# Patient Record
Sex: Female | Born: 1965
Health system: Southern US, Community
[De-identification: ages and names within clinical notes are randomized; demographics above are authoritative.]

## PROBLEM LIST (undated history)

## (undated) DIAGNOSIS — F4310 Post-traumatic stress disorder, unspecified: Secondary | ICD-10-CM

## (undated) DIAGNOSIS — D219 Benign neoplasm of connective and other soft tissue, unspecified: Secondary | ICD-10-CM

## (undated) HISTORY — PX: ABDOMINAL HYSTERECTOMY: SHX81

## (undated) HISTORY — DX: Benign neoplasm of connective and other soft tissue, unspecified: D21.9

## (undated) HISTORY — PX: TONSILLECTOMY: SUR1361

## (undated) HISTORY — PX: APPENDECTOMY: SHX54

## (undated) HISTORY — PX: SHOULDER SURGERY: SHX246

## (undated) HISTORY — DX: Post-traumatic stress disorder, unspecified: F43.10

---

## 1998-05-07 ENCOUNTER — Emergency Department (HOSPITAL_COMMUNITY): Admission: EM | Admit: 1998-05-07 | Discharge: 1998-05-07 | Payer: Self-pay | Admitting: Emergency Medicine

## 1998-12-20 ENCOUNTER — Emergency Department (HOSPITAL_COMMUNITY): Admission: EM | Admit: 1998-12-20 | Discharge: 1998-12-20 | Payer: Self-pay | Admitting: Emergency Medicine

## 1999-01-16 ENCOUNTER — Emergency Department (HOSPITAL_COMMUNITY): Admission: EM | Admit: 1999-01-16 | Discharge: 1999-01-16 | Payer: Self-pay | Admitting: Emergency Medicine

## 1999-01-16 ENCOUNTER — Encounter: Payer: Self-pay | Admitting: Emergency Medicine

## 1999-06-03 ENCOUNTER — Other Ambulatory Visit: Admission: RE | Admit: 1999-06-03 | Discharge: 1999-06-03 | Payer: Self-pay | Admitting: Gynecology

## 2001-06-16 ENCOUNTER — Ambulatory Visit (HOSPITAL_COMMUNITY): Admission: RE | Admit: 2001-06-16 | Discharge: 2001-06-16 | Payer: Self-pay | Admitting: Gastroenterology

## 2001-06-16 ENCOUNTER — Encounter: Payer: Self-pay | Admitting: Gastroenterology

## 2005-02-16 ENCOUNTER — Encounter: Admission: RE | Admit: 2005-02-16 | Discharge: 2005-02-16 | Payer: Self-pay | Admitting: Internal Medicine

## 2005-06-30 ENCOUNTER — Observation Stay (HOSPITAL_COMMUNITY): Admission: RE | Admit: 2005-06-30 | Discharge: 2005-07-01 | Payer: Self-pay | Admitting: Obstetrics & Gynecology

## 2011-09-22 ENCOUNTER — Other Ambulatory Visit: Payer: Self-pay | Admitting: Otolaryngology

## 2011-09-22 DIAGNOSIS — R519 Headache, unspecified: Secondary | ICD-10-CM

## 2011-09-24 ENCOUNTER — Ambulatory Visit
Admission: RE | Admit: 2011-09-24 | Discharge: 2011-09-24 | Disposition: A | Discharge status: Home / Self Care | Payer: BC Managed Care – PPO | Source: Ambulatory Visit | Attending: Otolaryngology | Admitting: Otolaryngology

## 2011-09-24 DIAGNOSIS — R519 Headache, unspecified: Secondary | ICD-10-CM

## 2011-09-24 IMAGING — CT CT NECK W/ CM
4 of 5 series · 16 of 33 positions shown, 19 images · IV contrast (75CC OMNI 300)
Comparison: None.

CLINICAL DATA: Left facial pain for 1 month.  Hoarseness for 1
week.

CT NECK WITH CONTRAST
TECHNIQUE: Multidetector CT imaging of the neck was performed with
intravenous contrast.
Contrast: 75mL OMNIPAQUE IOHEXOL 300 MG/ML IV SOLN

[Series 2: axial neck · axial · 0.48mm/px · z∈[-72,+82]mm · 5 of 94 slices shown, 7 images]
[im 16/94  soft-tissue]
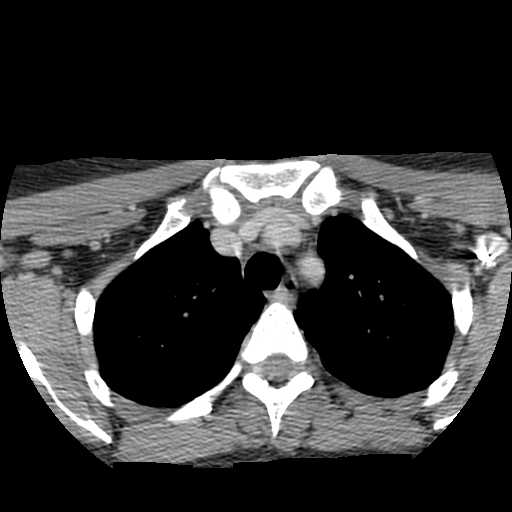
[im 16/94  bone]
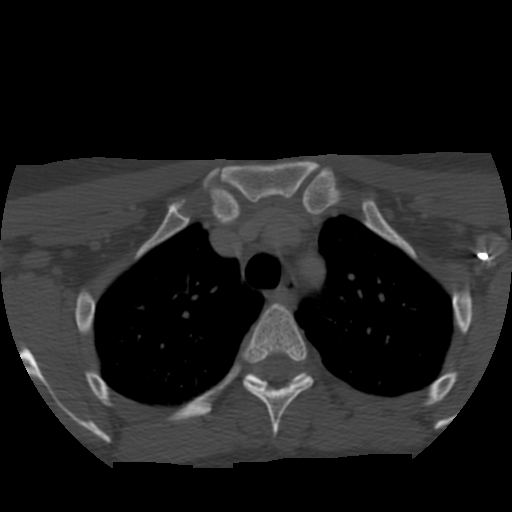
[im 32/94  bone]
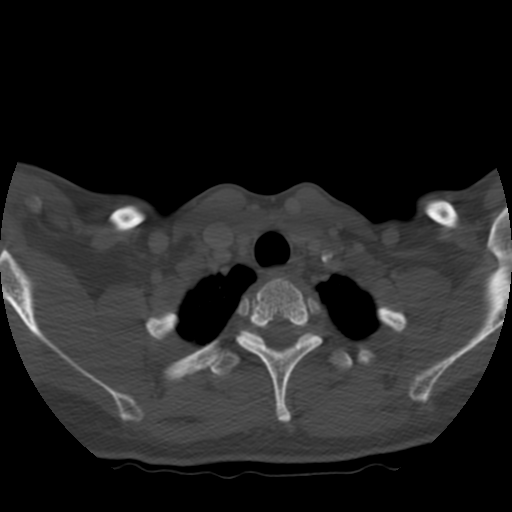
[im 47/94  bone]
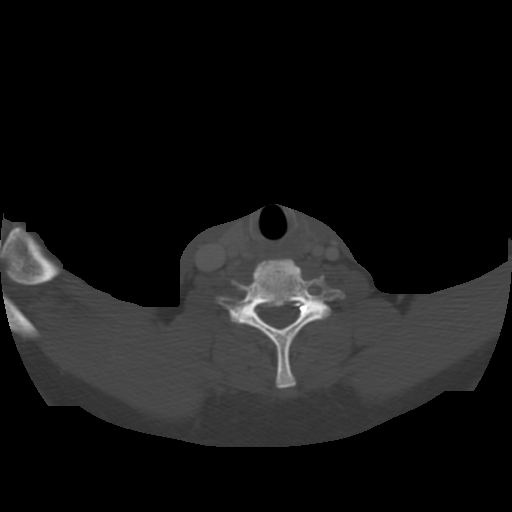
[im 63/94  bone]
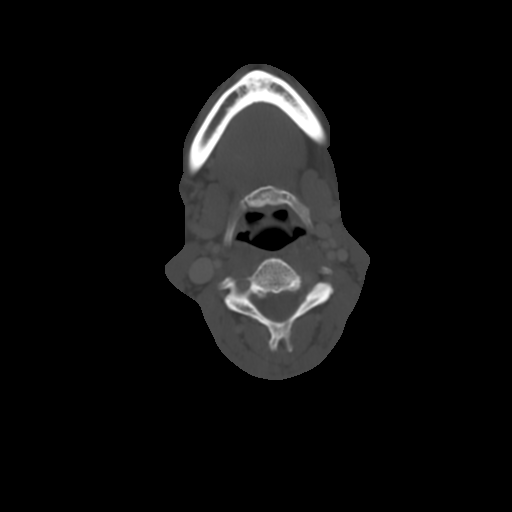
[im 78/94  soft-tissue]
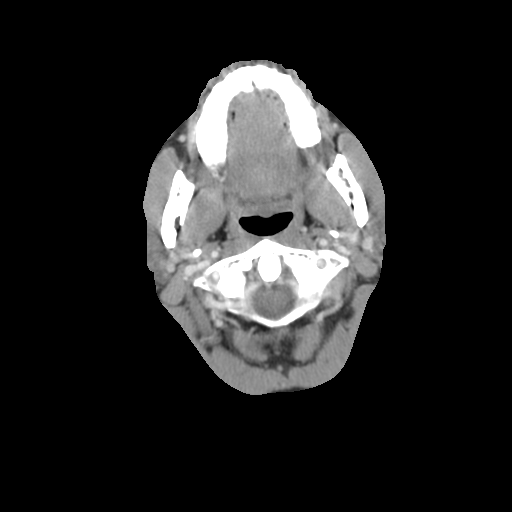
[im 78/94  bone]
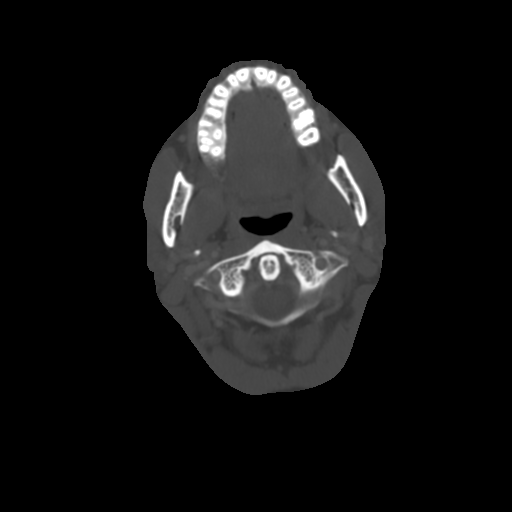

[Series 400: cor · coronal · 0.48mm/px · 3 of 84 slices shown]
[im 17/84  bone]
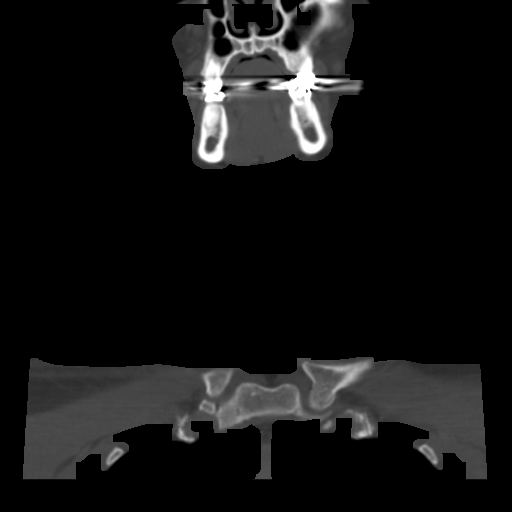
[im 34/84  bone]
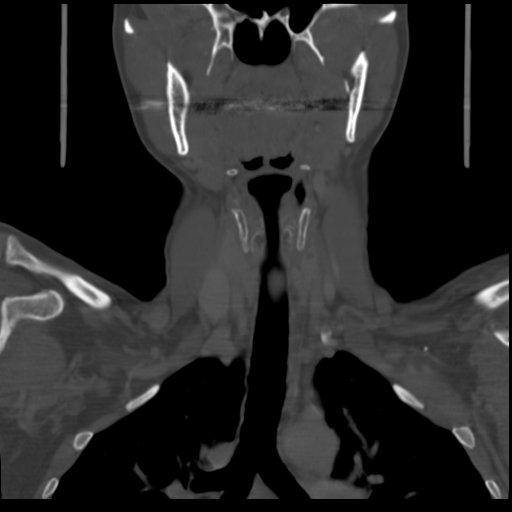
[im 50/84  bone]
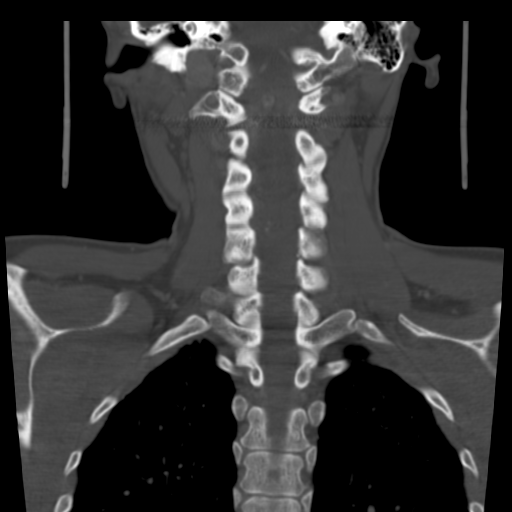

[Series 401: ang axials · axial · 0.48mm/px · z∈[-88,-6]mm · 3 of 87 slices shown]
[im 18/87  bone]
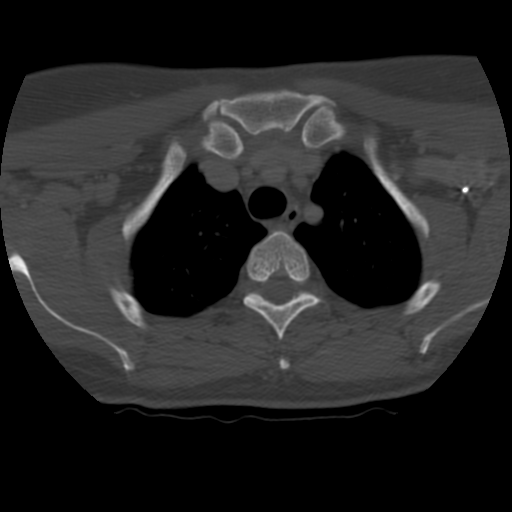
[im 35/87  bone]
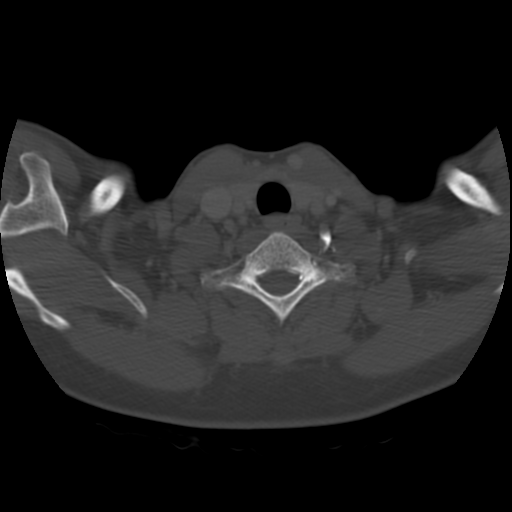
[im 52/87  bone]
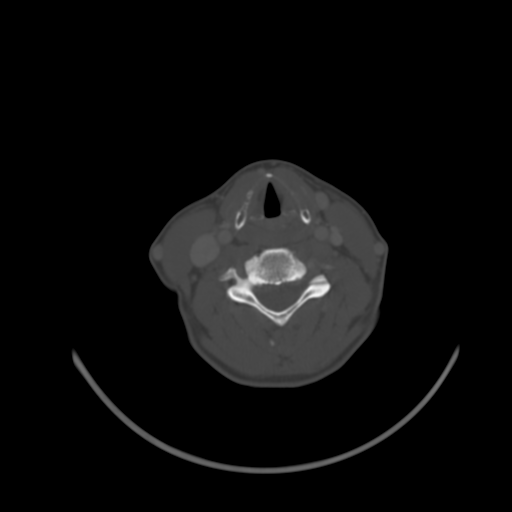

[Series 402: sag · sagittal · 0.48mm/px · 5 of 70 slices shown, 6 images]
[im 24/70  bone]
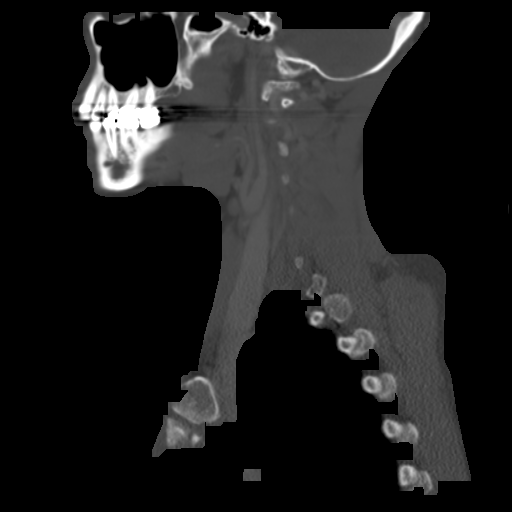
[im 29/70  bone]
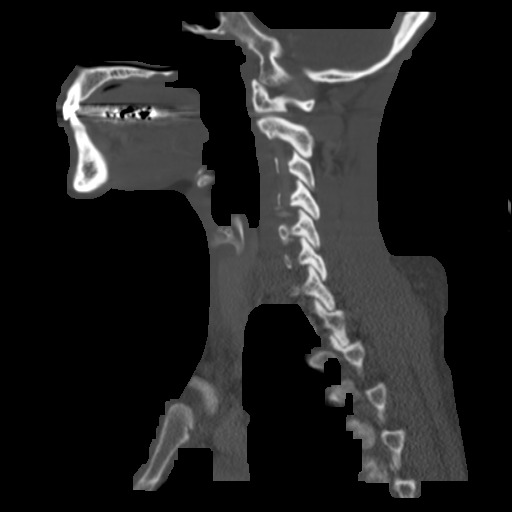
[im 35/70  soft-tissue]
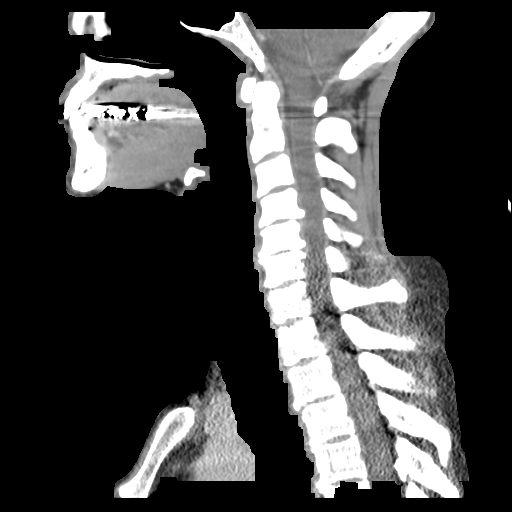
[im 35/70  bone]
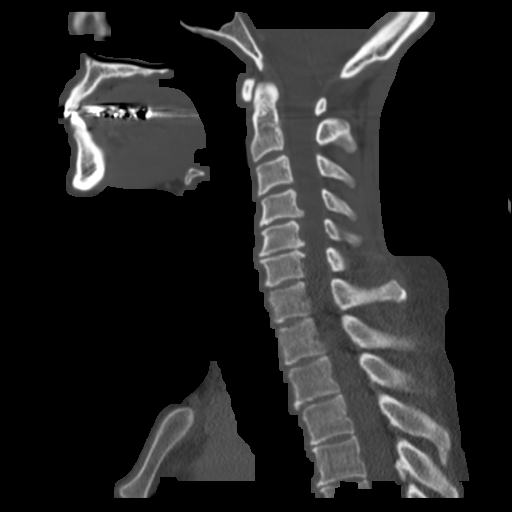
[im 41/70  bone]
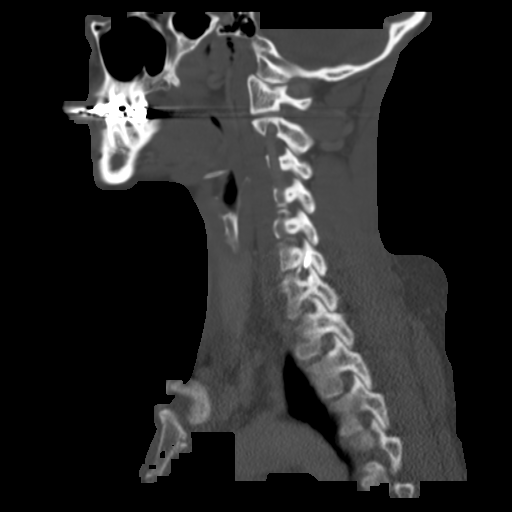
[im 47/70  bone]
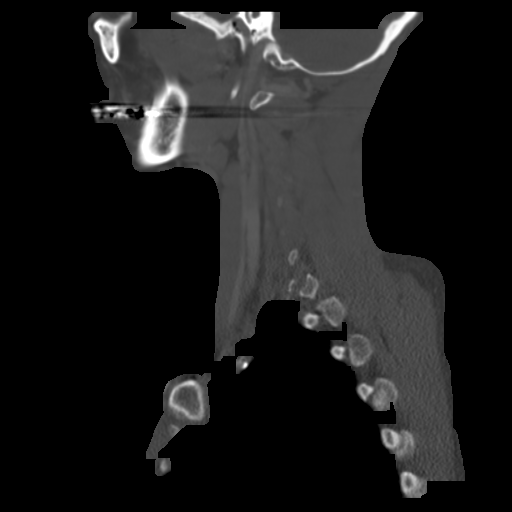

[16 of 33 positions shown; findings below may reference images not displayed]

FINDINGS: No mucosal lesion.  Airway midline.  No vocal cord
abnormality.  Larynx normal.  No tonsillar enlargement.  Normal
major and minor salivary glands.  No skull base or cervical spine
lesion. Moderate spondylosis.  Vascular structures widely patent.
Thyroid gland normal except for a subcentimeter left lobe cystic
lesion.  No mediastinal masses.  Lung apices clear. No visible
sinus or mastoid fluid.
IMPRESSION: Negative CT of the neck.  No cause seen for facial pain or
hoarseness.

## 2011-09-24 MED ORDER — IOHEXOL 300 MG/ML  SOLN
75.0000 mL | Freq: Once | INTRAMUSCULAR | Status: AC | PRN
  Administered 2011-09-24: 75 mL via INTRAVENOUS

## 2012-03-06 ENCOUNTER — Ambulatory Visit (INDEPENDENT_AMBULATORY_CARE_PROVIDER_SITE_OTHER): Payer: BC Managed Care – PPO | Admitting: Family Medicine

## 2012-03-06 ENCOUNTER — Ambulatory Visit: Payer: BC Managed Care – PPO

## 2012-03-06 VITALS — BP 100/60 | HR 58 | Temp 98.3°F | Resp 16 | Ht 64.0 in | Wt 143.0 lb

## 2012-03-06 DIAGNOSIS — R0600 Dyspnea, unspecified: Secondary | ICD-10-CM

## 2012-03-06 DIAGNOSIS — R0609 Other forms of dyspnea: Secondary | ICD-10-CM

## 2012-03-06 DIAGNOSIS — R0989 Other specified symptoms and signs involving the circulatory and respiratory systems: Secondary | ICD-10-CM

## 2012-03-06 DIAGNOSIS — D539 Nutritional anemia, unspecified: Secondary | ICD-10-CM

## 2012-03-06 LAB — POCT CBC
Granulocyte percent: 61.9 %G (ref 37–80)
HCT, POC: 38.5 % (ref 37.7–47.9)
Hemoglobin: 12.5 g/dL (ref 12.2–16.2)
Lymph, poc: 2.3 (ref 0.6–3.4)
MCH, POC: 31.8 pg — AB (ref 27–31.2)
MCHC: 32.5 g/dL (ref 31.8–35.4)
MCV: 97.9 fL — AB (ref 80–97)
MID (cbc): 0.5 (ref 0–0.9)
MPV: 10.7 fL (ref 0–99.8)
POC Granulocyte: 4.5 (ref 2–6.9)
POC LYMPH PERCENT: 31.2 %L (ref 10–50)
POC MID %: 6.9 %M (ref 0–12)
Platelet Count, POC: 225 10*3/uL (ref 142–424)
RBC: 3.93 M/uL — AB (ref 4.04–5.48)
RDW, POC: 13.4 %
WBC: 7.3 10*3/uL (ref 4.6–10.2)

## 2012-03-06 LAB — COMPREHENSIVE METABOLIC PANEL
ALT: 14 U/L (ref 0–35)
BUN: 17 mg/dL (ref 6–23)
CO2: 23 mEq/L (ref 19–32)
Calcium: 9.2 mg/dL (ref 8.4–10.5)
Chloride: 107 mEq/L (ref 96–112)
Creat: 0.69 mg/dL (ref 0.50–1.10)
Glucose, Bld: 80 mg/dL (ref 70–99)

## 2012-03-06 LAB — TSH: TSH: 1.97 u[IU]/mL (ref 0.350–4.500)

## 2012-03-06 IMAGING — CR DG RIBS 2V*L*
3 series · 3 of 3 positions shown · non-contrast
Comparison: None.

CLINICAL DATA: Chest wall pain the

LEFT RIBS - 2 VIEW

[PA]
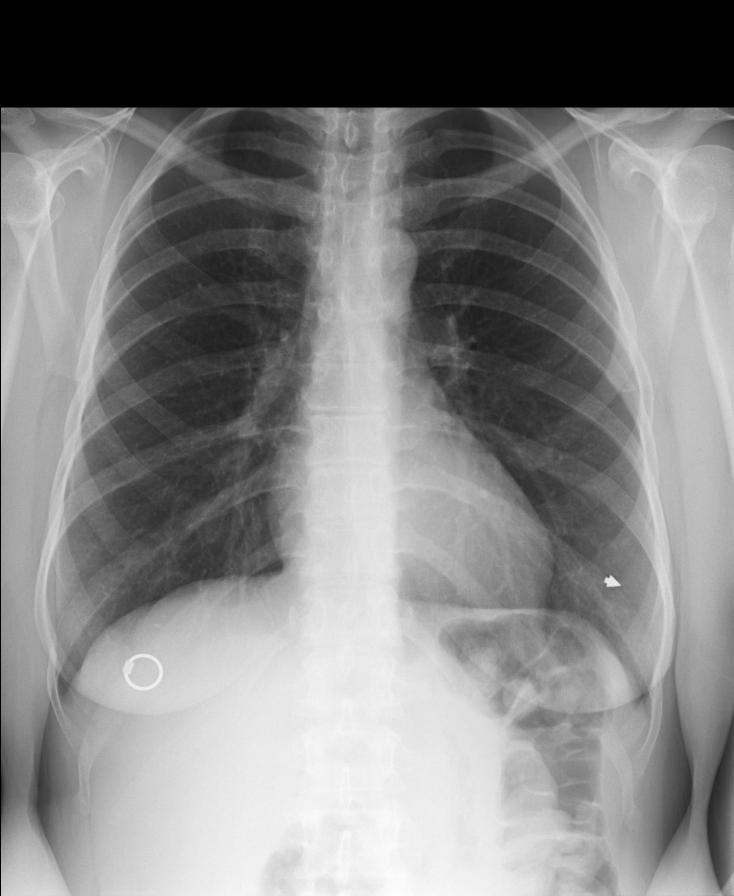

[AP]
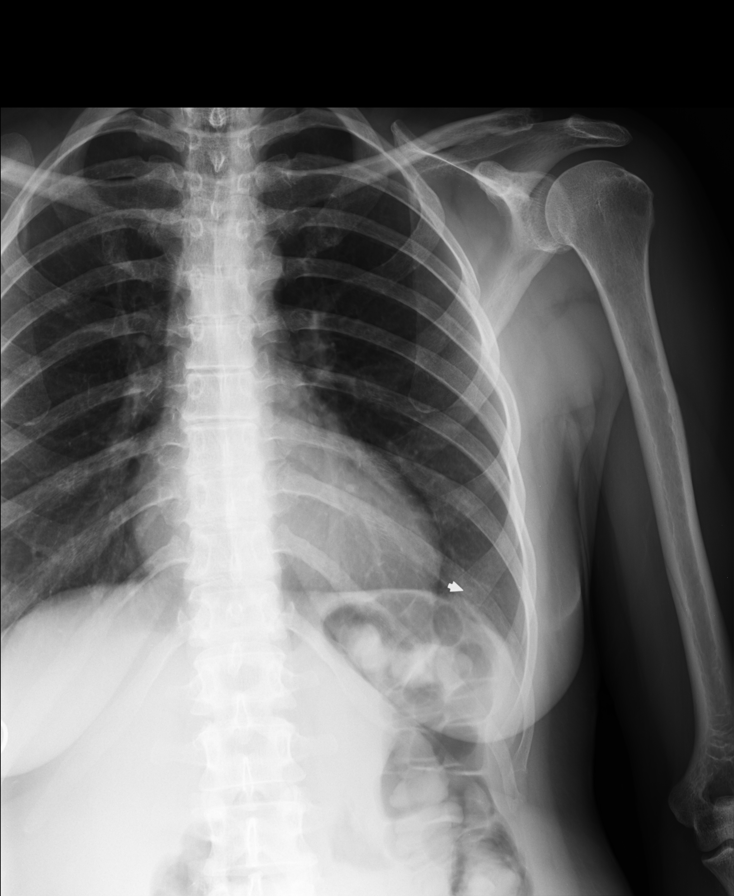

[lpo]
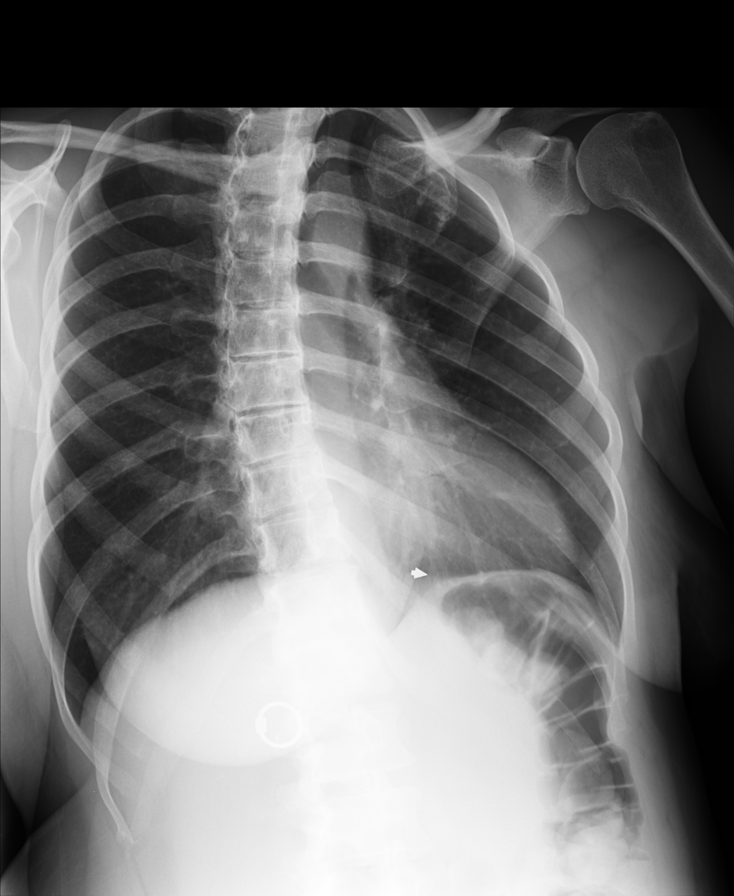

[3 of 3 positions shown; findings below may reference images not displayed]

FINDINGS: Normal mediastinum and cardiac silhouette.  Normal
pulmonary  vasculature.  No evidence of effusion, infiltrate, or
pneumothorax.  No acute bony abnormality.  Dedicated views of the
left ribs demonstrate no displaced fracture.
IMPRESSION: No acute cardiopulmonary process.

Clinically significant discrepancy from primary report, if
provided: None

## 2012-03-06 MED ORDER — BUDESONIDE-FORMOTEROL FUMARATE 160-4.5 MCG/ACT IN AERO: 2.0000 | INHALATION_SPRAY | Freq: Two times a day (BID) | RESPIRATORY_TRACT | Status: DC

## 2012-03-06 MED ORDER — PRENATAL PLUS 27-1 MG PO TABS: 1.0000 | ORAL_TABLET | Freq: Every day | ORAL | Status: DC

## 2012-03-06 NOTE — Progress Notes (Signed)
  Subjective:    Patient ID: Caitlin Pearson, female    DOB: 07/26/1966, 46 y.o.   MRN: [id number]  HPI  Flu like/ bronchitic illness in January with persistent cough for the 6 weeks that follow.  The second week of February developed posterior (L) sided chest wall pain. Since patient developed pain with deep inspiration.  Patient has developed DOE/SOB that is unchanged over the past 2-3 weeks. Does participate in yoga 3 times a week without difficulty however aerobic exercise difficult.  Feels as if breath is restricted.  History of smoking in the past quit 2002 Smoked approximately 17 years 1 ppd   Review of Systems  Constitutional: Positive for fatigue. Negative for fever and chills. Appetite change: appetitie "comes and goes"       Objective:   Physical Exam  Constitutional: She appears well-developed.  HENT:  Head: Normocephalic and atraumatic.  Nose: Mucosal edema: boggy swollen pale nasal mucose.  Mouth/Throat: Oropharyngeal exudate: clear pnd.  Eyes:    Cardiovascular: Normal rate, regular rhythm, S1 normal, S2 normal, normal heart sounds and normal pulses.   Pulmonary/Chest:    Abdominal: Normal appearance.      Results for orders placed in visit on 03/06/12  POCT CBC      Component Value Range   WBC 7.3  4.6 - 10.2 (K/uL)   Lymph, poc 2.3  0.6 - 3.4    POC LYMPH PERCENT 31.2  10 - 50 (%L)   MID (cbc) 0.5  0 - 0.9    POC MID % 6.9  0 - 12 (%M)   POC Granulocyte 4.5  2 - 6.9    Granulocyte percent 61.9  37 - 80 (%G)   RBC 3.93 (*) 4.04 - 5.48 (M/uL)   Hemoglobin 12.5  12.2 - 16.2 (g/dL)   HCT, POC 95.6  21.3 - 47.9 (%)   MCV 97.9 (*) 80 - 97 (fL)   MCH, POC 31.8 (*) 27 - 31.2 (pg)   MCHC 32.5  31.8 - 35.4 (g/dL)   RDW, POC 08.6     Platelet Count, POC 225  142 - 424 (K/uL)   MPV 10.7  0 - 99.8 (fL)    [facility] reading (PRIMARY) by  Dr. Hal Hope Hyperinflation      Assessment & Plan:   1. Dyspnea  POCT CBC, DG Ribs Unilateral Left, Pulmonary function  test, budesonide-formoterol (SYMBICORT) 160-4.5 MCG/ACT inhaler  2. Macrocytic anemia  Comprehensive metabolic panel, TSH, prenatal vitamin w/FE, FA (PRENATAL 1 + 1) 27-1 MG TABS   Anticipatory guidance

## 2012-03-06 NOTE — Patient Instructions (Signed)
(!)   multivitamin daily  Symbicort 2 puffs twice daily; after 10 days reduce to 1 puff twice daily

## 2012-03-20 ENCOUNTER — Telehealth: Payer: Self-pay

## 2012-03-20 NOTE — Telephone Encounter (Signed)
Pt wanted to advise dr Hal Hope that the inhaler rx is not really helping. Please call pt as soon as possible

## 2012-03-20 NOTE — Telephone Encounter (Signed)
LMOM to CB.  Advised patient RTC if symptoms haven't improved since OV on 4/14.

## 2012-03-21 NOTE — Telephone Encounter (Signed)
LM requesting return call.

## 2012-03-21 NOTE — Telephone Encounter (Signed)
Patient returned call.  Continues to have rib pain. CXR with rib detail negative. Patient needs refill of Dulera however has financial limitations. Will look for another coupon for ICS and leave at front desk for patient. Will also prescribe Mobic for pain

## 2012-03-21 NOTE — Telephone Encounter (Signed)
Patient stated that the inhaler is not working and you spoke with her about using another inhaler if she needed it.  She also needs  Another coupon if possible.

## 2012-03-22 ENCOUNTER — Other Ambulatory Visit: Payer: Self-pay | Admitting: Family Medicine

## 2012-03-22 MED ORDER — MOMETASONE FURO-FORMOTEROL FUM 200-5 MCG/ACT IN AERO: 1.0000 | INHALATION_SPRAY | Freq: Two times a day (BID) | RESPIRATORY_TRACT | Status: DC

## 2012-03-23 ENCOUNTER — Telehealth: Payer: Self-pay

## 2012-03-23 NOTE — Telephone Encounter (Signed)
.  [facility] The patient would like to ask Dr. Hal Hope for a Rx for albuterol for day to day use since she does not want to use a medication with steroids.  The patient thanked Dr. Hal Hope for the coupon she left for patient today.  Please call patient regarding Rx [phone].

## 2012-03-25 ENCOUNTER — Other Ambulatory Visit: Payer: Self-pay | Admitting: Family Medicine

## 2012-03-25 MED ORDER — ALBUTEROL SULFATE HFA 108 (90 BASE) MCG/ACT IN AERS: 1.0000 | INHALATION_SPRAY | Freq: Four times a day (QID) | RESPIRATORY_TRACT | Status: DC | PRN

## 2012-03-25 NOTE — Telephone Encounter (Signed)
Please let patient know that albuterol was e prescribed to her pharmacy.

## 2012-06-27 ENCOUNTER — Encounter: Payer: Self-pay | Admitting: Family Medicine

## 2013-01-26 ENCOUNTER — Encounter (HOSPITAL_BASED_OUTPATIENT_CLINIC_OR_DEPARTMENT_OTHER): Payer: Self-pay

## 2013-01-26 ENCOUNTER — Emergency Department (HOSPITAL_BASED_OUTPATIENT_CLINIC_OR_DEPARTMENT_OTHER)
Admission: EM | Admit: 2013-01-26 | Discharge: 2013-01-26 | Disposition: A | Discharge status: Home / Self Care | Payer: Worker's Compensation | Attending: Emergency Medicine | Admitting: Emergency Medicine

## 2013-01-26 DIAGNOSIS — Z79899 Other long term (current) drug therapy: Secondary | ICD-10-CM | POA: Insufficient documentation

## 2013-01-26 DIAGNOSIS — R42 Dizziness and giddiness: Secondary | ICD-10-CM | POA: Insufficient documentation

## 2013-01-26 DIAGNOSIS — M542 Cervicalgia: Secondary | ICD-10-CM | POA: Insufficient documentation

## 2013-01-26 DIAGNOSIS — F0781 Postconcussional syndrome: Secondary | ICD-10-CM | POA: Insufficient documentation

## 2013-01-26 DIAGNOSIS — G8911 Acute pain due to trauma: Secondary | ICD-10-CM | POA: Insufficient documentation

## 2013-01-26 NOTE — ED Notes (Signed)
Reports work related head injury feb 18th-was dx with cervical sprain and concussion-c/o HA, dizziness and numbness to l"left side-pt with steady gait to triage-answering all ?s appropriately

## 2013-01-26 NOTE — ED Notes (Signed)
MD at bedside. 

## 2013-01-26 NOTE — ED Provider Notes (Addendum)
History     CSN: [id number]  Arrival date & time 01/26/13  1516   First MD Initiated Contact with Patient 01/26/13 1543      Chief Complaint  Patient presents with  . Head Injury    (Consider location/radiation/quality/duration/timing/severity/associated sxs/prior treatment) HPI Comments: Patient presents to the ER for evaluation of persistent headache after head injury. Patient reports that she fell at work and injured her head on February 18. She was diagnosed with a cervical sprain and concussion. Patient has had consistent headache and dizziness. She says she is having trouble concentrating and some memory loss. She also has noticed some numbness and tingling on the left side of her face. It hurts when she turns her eyes to the left. She has been undergoing physical therapy. She was seen yesterday and told that she needed to be reevaluated. She tells me that her workers comp case worker told her to come to the emergency department to get a referral to a neurologist.  Patient is a 47 y.o. female presenting with head injury.  Head Injury Associated symptoms: headache and neck pain     History reviewed. No pertinent past medical history.  Past Surgical History  Procedure Laterality Date  . Shoulder surgery    . Abdominal hysterectomy      No family history on file.  History  Substance Use Topics  . Smoking status: Never Smoker   . Smokeless tobacco: Not on file  . Alcohol Use: No    OB History   Grav Para Term Preterm Abortions TAB SAB Ect Mult Living                  Review of Systems  Constitutional: Negative for fever.  HENT: Positive for neck pain.   Neurological: Positive for dizziness and headaches.  Psychiatric/Behavioral: Positive for decreased concentration.  All other systems reviewed and are negative.    Allergies  Penicillins  Home Medications   Current Outpatient Rx  Name  Route  Sig  Dispense  Refill  . albuterol (PROVENTIL HFA;VENTOLIN HFA)  108 (90 BASE) MCG/ACT inhaler   Inhalation   Inhale 1 puff into the lungs every 6 (six) hours as needed for wheezing.   1 Inhaler   3   . budesonide-formoterol (SYMBICORT) 160-4.5 MCG/ACT inhaler   Inhalation   Inhale 2 puffs into the lungs 2 (two) times daily.   1 Inhaler   3   . Mometasone Furo-Formoterol Fum 200-5 MCG/ACT AERO   Inhalation   Inhale 1 puff into the lungs 2 (two) times daily.   1 Inhaler   1   . prenatal vitamin w/FE, FA (PRENATAL 1 + 1) 27-1 MG TABS   Oral   Take 1 tablet by mouth daily.   90 each   3     BP 109/80  Pulse 71  Temp(Src) 98.2 F (36.8 C) (Oral)  Resp 18  Ht 5\' 4"  (1.626 m)  Wt 138 lb (62.596 kg)  BMI 23.68 kg/m2  SpO2 97%  Physical Exam  Constitutional: She is oriented to person, place, and time. She appears well-developed and well-nourished. No distress.  HENT:  Head: Normocephalic and atraumatic.  Right Ear: Hearing normal.  Nose: Nose normal.  Mouth/Throat: Oropharynx is clear and moist and mucous membranes are normal.  Eyes: Conjunctivae and EOM are normal. Pupils are equal, round, and reactive to light.  Neck: Normal range of motion. Neck supple.  Cardiovascular: Normal rate, regular rhythm, S1 normal and S2 normal.  Exam reveals no gallop and no friction rub.   No murmur heard. Pulmonary/Chest: Effort normal and breath sounds normal. No respiratory distress. She exhibits no tenderness.  Abdominal: Soft. Normal appearance and bowel sounds are normal. There is no hepatosplenomegaly. There is no tenderness. There is no rebound, no guarding, no tenderness at McBurney's point and negative Murphy's sign. No hernia.  Musculoskeletal: Normal range of motion.  Neurological: She is alert and oriented to person, place, and time. She has normal strength. No cranial nerve deficit or sensory deficit. Coordination normal. GCS eye subscore is 4. GCS verbal subscore is 5. GCS motor subscore is 6.  Skin: Skin is warm, dry and intact. No rash  noted. No cyanosis.  Psychiatric: She has a normal mood and affect. Her speech is normal and behavior is normal. Thought content normal.    ED Course  Procedures (including critical care time)  Labs Reviewed - No data to display No results found.   Diagnosis: Postconcussive syndrome    MDM  Patient presents to the ER stating that she needs referral to a neurologist. She had a head injury on February 18 and has had persistent symptoms as outlined above. She reports that she is having difficulty looking to the left, but she has conjugate gaze with no cranial nerve deficit on neurologic examination. Neurologic exam is entirely normal.  When I began to explain to the patient that often times workers comp does not honor referrals from the emergency department, patient became very belligerent. She essentially yelled at me that her case worker told her to come here and I needed to refer her to someone.  I once again explained to her that I would gladly refer her to a neurologist, but that the neurologist is not obligated to see her. I tried to prepare her for the possibility that she may need to work with her case worker to make sure the referral goes through. She seems to understand.  I don't see any neurologic deficits to require further workup. She might need an MRI but this is not available at this site. It does not need to be done emergently. I do not feel that CT scan would be of benefit at this time. Patient requires neurologic evaluation and was referred. She can return to the ER if her symptoms worsen.      Gilda Crease, MD 01/26/13 1622  Gilda Crease, MD 01/26/13 [phone]

## 2013-09-28 ENCOUNTER — Other Ambulatory Visit: Payer: Self-pay

## 2013-11-28 DIAGNOSIS — F0781 Postconcussional syndrome: Secondary | ICD-10-CM | POA: Insufficient documentation

## 2013-11-28 DIAGNOSIS — G4700 Insomnia, unspecified: Secondary | ICD-10-CM | POA: Insufficient documentation

## 2014-01-29 DIAGNOSIS — M542 Cervicalgia: Secondary | ICD-10-CM | POA: Insufficient documentation

## 2016-04-08 DIAGNOSIS — S060X0S Concussion without loss of consciousness, sequela: Secondary | ICD-10-CM | POA: Diagnosis not present

## 2016-04-08 DIAGNOSIS — F4321 Adjustment disorder with depressed mood: Secondary | ICD-10-CM | POA: Diagnosis not present

## 2016-04-08 DIAGNOSIS — F068 Other specified mental disorders due to known physiological condition: Secondary | ICD-10-CM | POA: Diagnosis not present

## 2016-04-16 DIAGNOSIS — S060X0S Concussion without loss of consciousness, sequela: Secondary | ICD-10-CM | POA: Diagnosis not present

## 2016-04-16 DIAGNOSIS — F068 Other specified mental disorders due to known physiological condition: Secondary | ICD-10-CM | POA: Diagnosis not present

## 2016-04-16 DIAGNOSIS — F4321 Adjustment disorder with depressed mood: Secondary | ICD-10-CM | POA: Diagnosis not present

## 2016-04-24 DIAGNOSIS — F068 Other specified mental disorders due to known physiological condition: Secondary | ICD-10-CM | POA: Diagnosis not present

## 2016-04-24 DIAGNOSIS — S060X0S Concussion without loss of consciousness, sequela: Secondary | ICD-10-CM | POA: Diagnosis not present

## 2016-04-24 DIAGNOSIS — F4321 Adjustment disorder with depressed mood: Secondary | ICD-10-CM | POA: Diagnosis not present

## 2016-04-30 DIAGNOSIS — F068 Other specified mental disorders due to known physiological condition: Secondary | ICD-10-CM | POA: Diagnosis not present

## 2016-04-30 DIAGNOSIS — S060X0S Concussion without loss of consciousness, sequela: Secondary | ICD-10-CM | POA: Diagnosis not present

## 2016-04-30 DIAGNOSIS — F4321 Adjustment disorder with depressed mood: Secondary | ICD-10-CM | POA: Diagnosis not present

## 2016-05-07 DIAGNOSIS — S060X0S Concussion without loss of consciousness, sequela: Secondary | ICD-10-CM | POA: Diagnosis not present

## 2016-05-07 DIAGNOSIS — F068 Other specified mental disorders due to known physiological condition: Secondary | ICD-10-CM | POA: Diagnosis not present

## 2016-05-07 DIAGNOSIS — F4321 Adjustment disorder with depressed mood: Secondary | ICD-10-CM | POA: Diagnosis not present

## 2016-05-15 DIAGNOSIS — F4321 Adjustment disorder with depressed mood: Secondary | ICD-10-CM | POA: Diagnosis not present

## 2016-05-15 DIAGNOSIS — S060X0S Concussion without loss of consciousness, sequela: Secondary | ICD-10-CM | POA: Diagnosis not present

## 2016-05-15 DIAGNOSIS — F068 Other specified mental disorders due to known physiological condition: Secondary | ICD-10-CM | POA: Diagnosis not present

## 2016-05-21 DIAGNOSIS — S060X0S Concussion without loss of consciousness, sequela: Secondary | ICD-10-CM | POA: Diagnosis not present

## 2016-05-21 DIAGNOSIS — F068 Other specified mental disorders due to known physiological condition: Secondary | ICD-10-CM | POA: Diagnosis not present

## 2016-05-21 DIAGNOSIS — F4321 Adjustment disorder with depressed mood: Secondary | ICD-10-CM | POA: Diagnosis not present

## 2016-05-28 DIAGNOSIS — F4321 Adjustment disorder with depressed mood: Secondary | ICD-10-CM | POA: Diagnosis not present

## 2016-05-28 DIAGNOSIS — F068 Other specified mental disorders due to known physiological condition: Secondary | ICD-10-CM | POA: Diagnosis not present

## 2016-05-28 DIAGNOSIS — S060X0S Concussion without loss of consciousness, sequela: Secondary | ICD-10-CM | POA: Diagnosis not present

## 2016-06-05 DIAGNOSIS — F068 Other specified mental disorders due to known physiological condition: Secondary | ICD-10-CM | POA: Diagnosis not present

## 2016-06-05 DIAGNOSIS — F4321 Adjustment disorder with depressed mood: Secondary | ICD-10-CM | POA: Diagnosis not present

## 2016-06-05 DIAGNOSIS — S060X0S Concussion without loss of consciousness, sequela: Secondary | ICD-10-CM | POA: Diagnosis not present

## 2016-06-16 DIAGNOSIS — F4321 Adjustment disorder with depressed mood: Secondary | ICD-10-CM | POA: Diagnosis not present

## 2016-06-16 DIAGNOSIS — F068 Other specified mental disorders due to known physiological condition: Secondary | ICD-10-CM | POA: Diagnosis not present

## 2016-06-16 DIAGNOSIS — S060X0S Concussion without loss of consciousness, sequela: Secondary | ICD-10-CM | POA: Diagnosis not present

## 2016-06-26 DIAGNOSIS — F4321 Adjustment disorder with depressed mood: Secondary | ICD-10-CM | POA: Diagnosis not present

## 2016-07-03 DIAGNOSIS — F4321 Adjustment disorder with depressed mood: Secondary | ICD-10-CM | POA: Diagnosis not present

## 2016-07-09 DIAGNOSIS — F4321 Adjustment disorder with depressed mood: Secondary | ICD-10-CM | POA: Diagnosis not present

## 2016-07-24 DIAGNOSIS — F4321 Adjustment disorder with depressed mood: Secondary | ICD-10-CM | POA: Diagnosis not present

## 2016-08-03 DIAGNOSIS — F4321 Adjustment disorder with depressed mood: Secondary | ICD-10-CM | POA: Diagnosis not present

## 2016-08-12 DIAGNOSIS — F4321 Adjustment disorder with depressed mood: Secondary | ICD-10-CM | POA: Diagnosis not present

## 2016-08-17 DIAGNOSIS — F4321 Adjustment disorder with depressed mood: Secondary | ICD-10-CM | POA: Diagnosis not present

## 2016-08-26 DIAGNOSIS — F4321 Adjustment disorder with depressed mood: Secondary | ICD-10-CM | POA: Diagnosis not present

## 2016-09-04 DIAGNOSIS — F4321 Adjustment disorder with depressed mood: Secondary | ICD-10-CM | POA: Diagnosis not present

## 2016-09-11 DIAGNOSIS — F4321 Adjustment disorder with depressed mood: Secondary | ICD-10-CM | POA: Diagnosis not present

## 2016-09-16 DIAGNOSIS — F4321 Adjustment disorder with depressed mood: Secondary | ICD-10-CM | POA: Diagnosis not present

## 2016-09-28 DIAGNOSIS — F4321 Adjustment disorder with depressed mood: Secondary | ICD-10-CM | POA: Diagnosis not present

## 2016-10-05 DIAGNOSIS — F4321 Adjustment disorder with depressed mood: Secondary | ICD-10-CM | POA: Diagnosis not present

## 2016-10-13 DIAGNOSIS — Z13228 Encounter for screening for other metabolic disorders: Secondary | ICD-10-CM | POA: Diagnosis not present

## 2016-10-13 DIAGNOSIS — B377 Candidal sepsis: Secondary | ICD-10-CM | POA: Diagnosis not present

## 2016-10-13 DIAGNOSIS — R799 Abnormal finding of blood chemistry, unspecified: Secondary | ICD-10-CM | POA: Diagnosis not present

## 2016-10-13 DIAGNOSIS — R5383 Other fatigue: Secondary | ICD-10-CM | POA: Diagnosis not present

## 2016-10-14 DIAGNOSIS — F4321 Adjustment disorder with depressed mood: Secondary | ICD-10-CM | POA: Diagnosis not present

## 2016-10-19 DIAGNOSIS — F4321 Adjustment disorder with depressed mood: Secondary | ICD-10-CM | POA: Diagnosis not present

## 2016-10-27 DIAGNOSIS — F4321 Adjustment disorder with depressed mood: Secondary | ICD-10-CM | POA: Diagnosis not present

## 2016-11-05 DIAGNOSIS — F4321 Adjustment disorder with depressed mood: Secondary | ICD-10-CM | POA: Diagnosis not present

## 2016-11-12 DIAGNOSIS — F4321 Adjustment disorder with depressed mood: Secondary | ICD-10-CM | POA: Diagnosis not present

## 2016-11-26 DIAGNOSIS — F4321 Adjustment disorder with depressed mood: Secondary | ICD-10-CM | POA: Diagnosis not present

## 2016-12-03 DIAGNOSIS — F4321 Adjustment disorder with depressed mood: Secondary | ICD-10-CM | POA: Diagnosis not present

## 2016-12-10 DIAGNOSIS — F4321 Adjustment disorder with depressed mood: Secondary | ICD-10-CM | POA: Diagnosis not present

## 2016-12-18 DIAGNOSIS — F4321 Adjustment disorder with depressed mood: Secondary | ICD-10-CM | POA: Diagnosis not present

## 2016-12-24 DIAGNOSIS — F4321 Adjustment disorder with depressed mood: Secondary | ICD-10-CM | POA: Diagnosis not present

## 2018-02-18 DIAGNOSIS — M25551 Pain in right hip: Secondary | ICD-10-CM | POA: Diagnosis not present

## 2018-02-18 DIAGNOSIS — M5136 Other intervertebral disc degeneration, lumbar region: Secondary | ICD-10-CM | POA: Diagnosis not present

## 2018-02-21 ENCOUNTER — Ambulatory Visit: Payer: BLUE CROSS/BLUE SHIELD | Admitting: Sports Medicine

## 2018-02-21 ENCOUNTER — Encounter: Payer: Self-pay | Admitting: Sports Medicine

## 2018-02-21 VITALS — BP 104/72 | Ht 64.0 in | Wt 134.0 lb

## 2018-02-21 DIAGNOSIS — S76311A Strain of muscle, fascia and tendon of the posterior muscle group at thigh level, right thigh, initial encounter: Secondary | ICD-10-CM

## 2018-02-21 MED ORDER — DICLOFENAC SODIUM 75 MG PO TBEC: 75.0000 mg | DELAYED_RELEASE_TABLET | Freq: Two times a day (BID) | ORAL | 0 refills | Status: DC

## 2018-02-21 NOTE — Progress Notes (Signed)
   Subjective:    Patient ID: Caitlin Pearson, female    DOB: 01-27-1966, 52 y.o.   MRN: [id number]   CC: hamstring tear  HPI: Patient is a 52 year old female who presents today for hamstring pain in in the setting of a recent diagnosis for hamstring tear.Patient reports that two weeks ago she was running to catch an uber at the [place] airport when she felt a "pop". Patient states that she was unable to run immediately after and had some swelling and bruising for the next few days. Patient continue to have pain and was seen by orthopedic who diagnosed her with a hamstring tear. Patient has been using heat which seem to provide some relief with her pain. She has not filled her flexeril prescription yet but reports a spasms that prevent her from sleeping at night. 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  Smoking status reviewed   ROS: all other systems were reviewed and are negative other than in the HPI   No past medical history on file.  Past Surgical History:  Procedure Laterality Date  . ABDOMINAL HYSTERECTOMY    . SHOULDER SURGERY      Past medical history, surgical, family, and social history reviewed and updated in the EMR as appropriate.  Objective:  BP 104/72   Ht 5\' 4"  (1.626 m)   Wt 134 lb (60.8 kg)   BMI 23.00 kg/m   Vitals and nursing note reviewed  General: NAD, pleasant, able to participate in exam Cardiac: RRR, normal heart sounds, no murmurs. 2+ radial and PT pulses bilaterally Respiratory: CTAB, normal effort, No wheezes, rales or rhonchi Abdomen: soft, nontender, nondistended, no hepatic or splenomegaly, +BS Right leg  Exam: 4/5 strength,sensation intact, tender to palpation in the proximal hamstring muscle with palpation, minimal bruising and no swelling noted. Flexion, extension, internal and external rotation intact. Extremities: no edema or cyanosis.  WWP. Skin: warm and dry, no rashes noted Neuro: alert and oriented x4, no focal deficits Psych: Normal affect and mood   Assessment & Plan:    Strain of right hamstring Patient presents after recent diagnosis of right  hamstring pain by ortho. Patient continue to experience significant pain in her right upper thigh on palpation . Swelling has resolved but mild bruising is present on exam. Heat has been helping with symptoms. U/S did not show any significant tear or inflammation of right hamstring. However given initial pain, swelling and bruising patient likely has a partial tear. She also endorses spasms. Patient will benefit from NSAIDs and PT at this point of her treatment. We also discussed compression either with thigh sleeve or bicycle short for additional support. No indication for surgical intervention. --Prescribe Diclofenac 75 mg bid --Compression for additional support --Physical therapy [facility]  --Flexeril 10 mg qhs     Marjie Skiff, MD [place] PGY-2  Patient seen and evaluated with the resident. I agree with the above plan of care. MSK ultrasound shows the proximal hamstring tendon to be intact. Treatment as above. X-rays of her lumbar spine and pelvis are reviewed. These are from an outside source. Pelvis x-rays are unremarkable. X-rays of the lumbar spine shows advanced degenerative disc disease at L5-S1 with a degenerative spondylolisthesis at L4-L5. However, her symptoms are not related to her lumbar spine pathology. Follow-up as needed.

## 2018-02-21 NOTE — Patient Instructions (Signed)
It was great seeing you today! We have addressed the following issues today  1. Base on our exam and Ultrasound finding you do not have a significant tear of your hamstring muscle. You do however most likely have a partial tear. 2. We will recommend compression to help pain either bicycle shorts or thigh sleeve.  3. Diclofenac twice a day to help with inflammation 4. Flexeril to help with muscle spasm and sleep. Only take in the evening. 5. Will also set you up with Physical therapy for the next few weeks.  If we did any lab work today, and the results require attention, either me or my nurse will get in touch with you. If everything is normal, you will get a letter in mail and a message via . If you don't hear from [place] in two weeks, please give [place] a call. Otherwise, we look forward to seeing you again at your next visit. If you have any questions or concerns before then, please call the clinic at [phone].  Please bring all your medications to every doctors visit  Sign up for My Chart to have easy access to your labs results, and communication with your Primary care physician. Please ask Front Desk for some assistance.   Please check-out at the front desk before leaving the clinic.    Take Care,   Dr. Andy Gauss

## 2018-02-21 NOTE — Assessment & Plan Note (Addendum)
Patient presents after recent diagnosis of right  hamstring pain by ortho. Patient continue to experience significant pain in her right upper thigh on palpation . Swelling has resolved but mild bruising is present on exam. Heat has been helping with symptoms. U/S did not show any significant tear or inflammation of right hamstring. However given initial pain, swelling and bruising patient likely has a partial tear. She also endorses spasms. Patient will benefit from NSAIDs and PT at this point of her treatment. We also discussed compression either with thigh sleeve or bicycle short for additional support. No indication for surgical intervention. --Prescribe Diclofenac 75 mg bid --Compression for additional support --Physical therapy [facility]  --Flexeril 10 mg qhs --Follow up in clinic as needed if symptoms do not improve or worsen.

## 2018-03-04 DIAGNOSIS — S76311D Strain of muscle, fascia and tendon of the posterior muscle group at thigh level, right thigh, subsequent encounter: Secondary | ICD-10-CM | POA: Diagnosis not present

## 2018-03-10 DIAGNOSIS — S76311D Strain of muscle, fascia and tendon of the posterior muscle group at thigh level, right thigh, subsequent encounter: Secondary | ICD-10-CM | POA: Diagnosis not present

## 2018-03-16 DIAGNOSIS — S76311D Strain of muscle, fascia and tendon of the posterior muscle group at thigh level, right thigh, subsequent encounter: Secondary | ICD-10-CM | POA: Diagnosis not present

## 2018-03-21 DIAGNOSIS — S76311D Strain of muscle, fascia and tendon of the posterior muscle group at thigh level, right thigh, subsequent encounter: Secondary | ICD-10-CM | POA: Diagnosis not present

## 2018-03-24 DIAGNOSIS — S76311D Strain of muscle, fascia and tendon of the posterior muscle group at thigh level, right thigh, subsequent encounter: Secondary | ICD-10-CM | POA: Diagnosis not present

## 2018-04-07 DIAGNOSIS — S76311D Strain of muscle, fascia and tendon of the posterior muscle group at thigh level, right thigh, subsequent encounter: Secondary | ICD-10-CM | POA: Diagnosis not present

## 2018-04-14 DIAGNOSIS — S76311D Strain of muscle, fascia and tendon of the posterior muscle group at thigh level, right thigh, subsequent encounter: Secondary | ICD-10-CM | POA: Diagnosis not present

## 2018-04-25 DIAGNOSIS — S76311D Strain of muscle, fascia and tendon of the posterior muscle group at thigh level, right thigh, subsequent encounter: Secondary | ICD-10-CM | POA: Diagnosis not present

## 2018-05-02 DIAGNOSIS — S76311D Strain of muscle, fascia and tendon of the posterior muscle group at thigh level, right thigh, subsequent encounter: Secondary | ICD-10-CM | POA: Diagnosis not present

## 2018-05-09 DIAGNOSIS — S76311D Strain of muscle, fascia and tendon of the posterior muscle group at thigh level, right thigh, subsequent encounter: Secondary | ICD-10-CM | POA: Diagnosis not present

## 2018-05-24 DIAGNOSIS — S76311D Strain of muscle, fascia and tendon of the posterior muscle group at thigh level, right thigh, subsequent encounter: Secondary | ICD-10-CM | POA: Diagnosis not present

## 2018-05-31 DIAGNOSIS — S76311D Strain of muscle, fascia and tendon of the posterior muscle group at thigh level, right thigh, subsequent encounter: Secondary | ICD-10-CM | POA: Diagnosis not present

## 2018-06-14 DIAGNOSIS — S76311D Strain of muscle, fascia and tendon of the posterior muscle group at thigh level, right thigh, subsequent encounter: Secondary | ICD-10-CM | POA: Diagnosis not present

## 2018-06-23 DIAGNOSIS — S76311D Strain of muscle, fascia and tendon of the posterior muscle group at thigh level, right thigh, subsequent encounter: Secondary | ICD-10-CM | POA: Diagnosis not present

## 2018-06-30 DIAGNOSIS — S76311D Strain of muscle, fascia and tendon of the posterior muscle group at thigh level, right thigh, subsequent encounter: Secondary | ICD-10-CM | POA: Diagnosis not present

## 2018-07-14 DIAGNOSIS — S76311D Strain of muscle, fascia and tendon of the posterior muscle group at thigh level, right thigh, subsequent encounter: Secondary | ICD-10-CM | POA: Diagnosis not present

## 2018-07-28 DIAGNOSIS — S76311D Strain of muscle, fascia and tendon of the posterior muscle group at thigh level, right thigh, subsequent encounter: Secondary | ICD-10-CM | POA: Diagnosis not present

## 2018-08-17 DIAGNOSIS — S76311D Strain of muscle, fascia and tendon of the posterior muscle group at thigh level, right thigh, subsequent encounter: Secondary | ICD-10-CM | POA: Diagnosis not present

## 2018-08-25 DIAGNOSIS — S76311D Strain of muscle, fascia and tendon of the posterior muscle group at thigh level, right thigh, subsequent encounter: Secondary | ICD-10-CM | POA: Diagnosis not present

## 2018-09-22 DIAGNOSIS — S76311D Strain of muscle, fascia and tendon of the posterior muscle group at thigh level, right thigh, subsequent encounter: Secondary | ICD-10-CM | POA: Diagnosis not present

## 2018-10-13 DIAGNOSIS — S76311D Strain of muscle, fascia and tendon of the posterior muscle group at thigh level, right thigh, subsequent encounter: Secondary | ICD-10-CM | POA: Diagnosis not present

## 2018-11-04 DIAGNOSIS — J019 Acute sinusitis, unspecified: Secondary | ICD-10-CM | POA: Diagnosis not present

## 2018-11-04 DIAGNOSIS — Z87828 Personal history of other (healed) physical injury and trauma: Secondary | ICD-10-CM | POA: Diagnosis not present

## 2018-11-04 DIAGNOSIS — L710 Perioral dermatitis: Secondary | ICD-10-CM | POA: Diagnosis not present

## 2018-11-04 DIAGNOSIS — F4310 Post-traumatic stress disorder, unspecified: Secondary | ICD-10-CM | POA: Diagnosis not present

## 2018-11-04 DIAGNOSIS — Z0001 Encounter for general adult medical examination with abnormal findings: Secondary | ICD-10-CM | POA: Diagnosis not present

## 2018-11-17 ENCOUNTER — Other Ambulatory Visit: Payer: Self-pay | Admitting: Family Medicine

## 2018-11-17 DIAGNOSIS — Z0001 Encounter for general adult medical examination with abnormal findings: Secondary | ICD-10-CM | POA: Diagnosis not present

## 2018-11-17 DIAGNOSIS — M542 Cervicalgia: Secondary | ICD-10-CM

## 2018-11-17 DIAGNOSIS — IMO0002 Reserved for concepts with insufficient information to code with codable children: Secondary | ICD-10-CM

## 2018-11-17 DIAGNOSIS — Z1231 Encounter for screening mammogram for malignant neoplasm of breast: Secondary | ICD-10-CM

## 2018-11-17 DIAGNOSIS — R229 Localized swelling, mass and lump, unspecified: Principal | ICD-10-CM

## 2018-11-17 DIAGNOSIS — K089 Disorder of teeth and supporting structures, unspecified: Secondary | ICD-10-CM | POA: Diagnosis not present

## 2018-11-28 ENCOUNTER — Other Ambulatory Visit: Payer: Self-pay | Admitting: Family Medicine

## 2018-11-30 ENCOUNTER — Other Ambulatory Visit: Payer: Self-pay

## 2018-12-09 ENCOUNTER — Other Ambulatory Visit: Payer: Self-pay

## 2018-12-09 DIAGNOSIS — K089 Disorder of teeth and supporting structures, unspecified: Secondary | ICD-10-CM | POA: Diagnosis not present

## 2018-12-13 ENCOUNTER — Other Ambulatory Visit: Payer: Self-pay | Admitting: Family Medicine

## 2018-12-13 DIAGNOSIS — R220 Localized swelling, mass and lump, head: Principal | ICD-10-CM

## 2018-12-13 DIAGNOSIS — J3489 Other specified disorders of nose and nasal sinuses: Secondary | ICD-10-CM

## 2018-12-16 ENCOUNTER — Other Ambulatory Visit: Payer: Self-pay

## 2018-12-22 ENCOUNTER — Other Ambulatory Visit: Payer: Self-pay

## 2018-12-29 ENCOUNTER — Ambulatory Visit
Admission: RE | Admit: 2018-12-29 | Discharge: 2018-12-29 | Disposition: A | Discharge status: Home / Self Care | Payer: BLUE CROSS/BLUE SHIELD | Source: Ambulatory Visit | Attending: Family Medicine | Admitting: Family Medicine

## 2018-12-29 DIAGNOSIS — R220 Localized swelling, mass and lump, head: Principal | ICD-10-CM

## 2018-12-29 DIAGNOSIS — J0100 Acute maxillary sinusitis, unspecified: Secondary | ICD-10-CM | POA: Diagnosis not present

## 2018-12-29 DIAGNOSIS — J3489 Other specified disorders of nose and nasal sinuses: Secondary | ICD-10-CM

## 2018-12-29 IMAGING — CT CT MAXILLOFACIAL W/ CM
1 series · 16 of 30 positions shown, 20 images · IV contrast (APPLIED)
Comparison: None.

CLINICAL DATA: Facial numbness and headache. Diagnosis of RIGHT
facial mass 2 months ago, 2 teeth extraction 2 weeks ago.

EXAM:
CT MAXILLOFACIAL WITH CONTRAST
TECHNIQUE: Multidetector CT imaging of the maxillofacial structures was
performed with intravenous contrast. Multiplanar CT image
reconstructions were also generated.
CONTRAST:  75mL [2D] IOPAMIDOL ([2D]) INJECTION 61%

[Series 4: maxofacial -soft · axial · 0.39mm/px · z∈[-190,-22]mm · 16 of 92 slices shown, 20 images]
[im 4/92  brain]
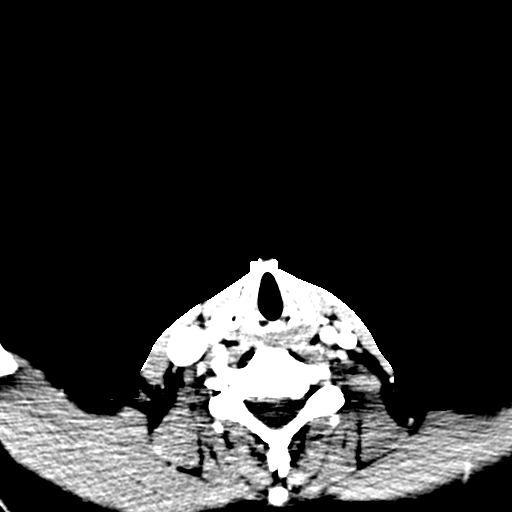
[im 4/92  bone]
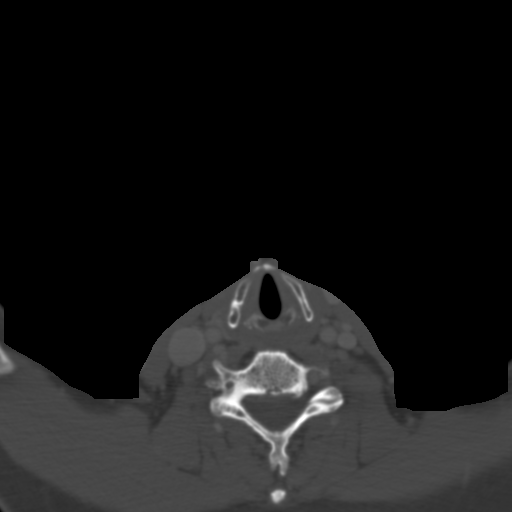
[im 10/92  bone]
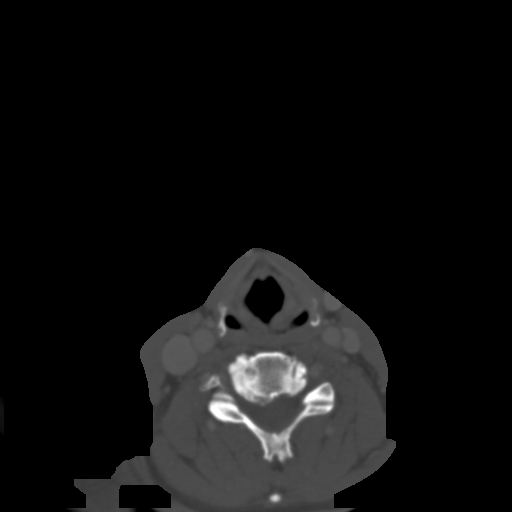
[im 16/92  bone]
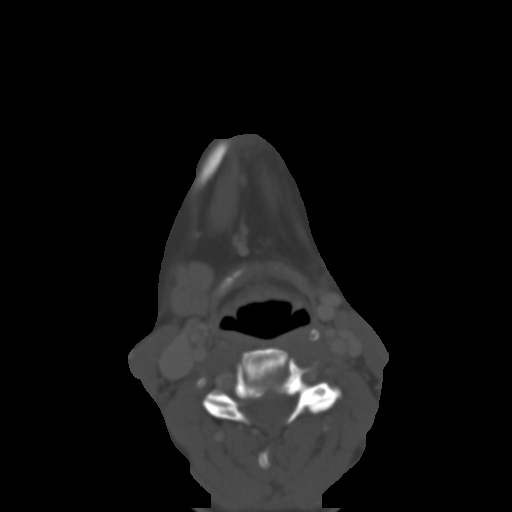
[im 22/92  bone]
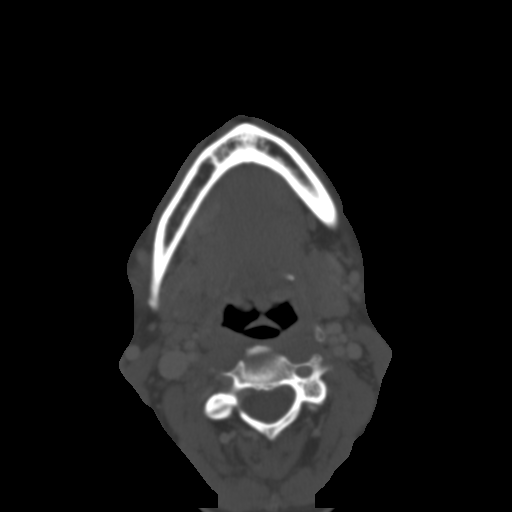
[im 26/92  brain]
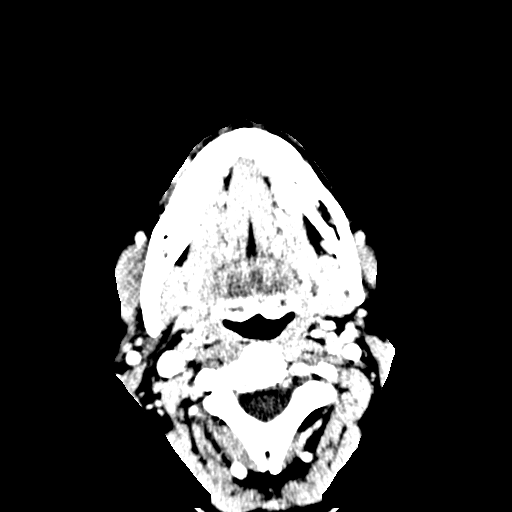
[im 26/92  bone]
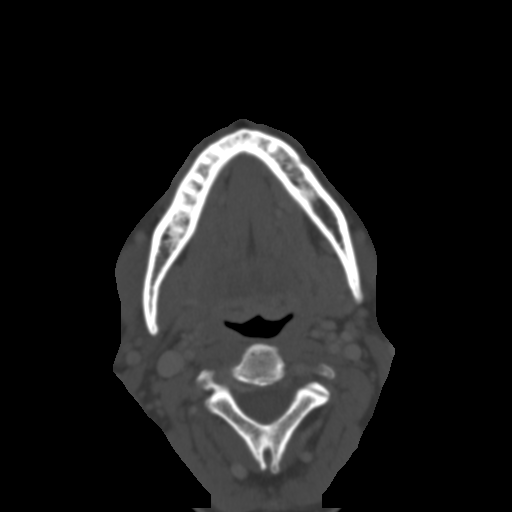
[im 32/92  bone]
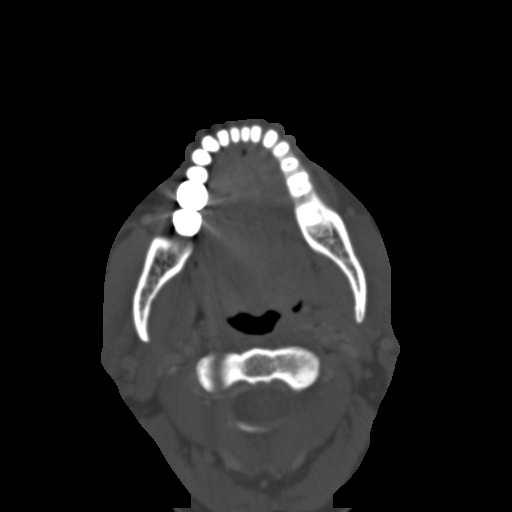
[im 38/92  bone]
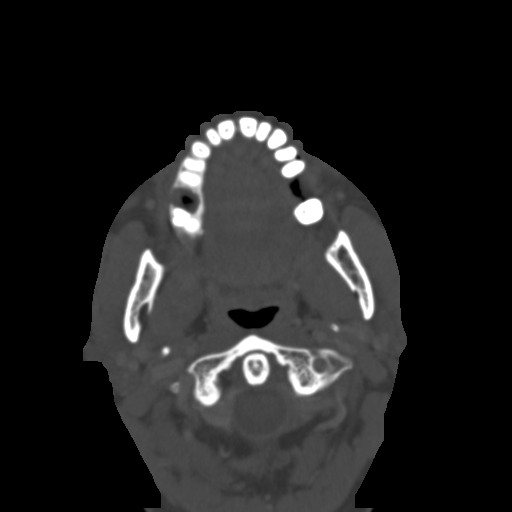
[im 44/92  bone]
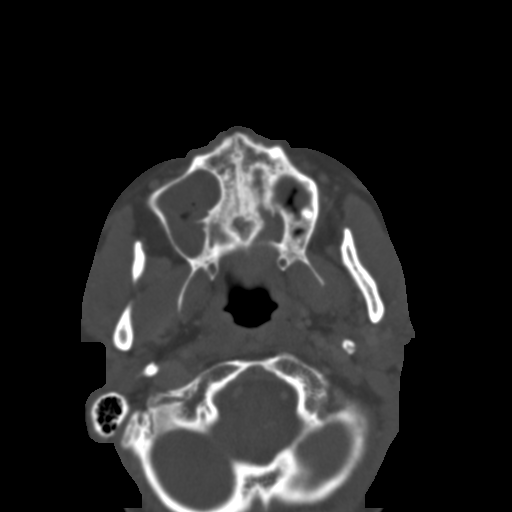
[im 48/92  brain]
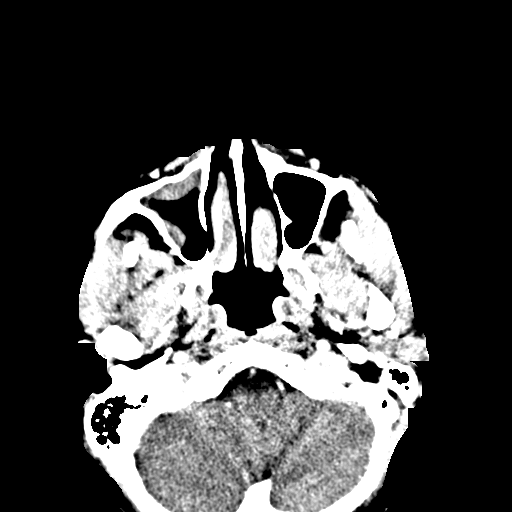
[im 48/92  bone]
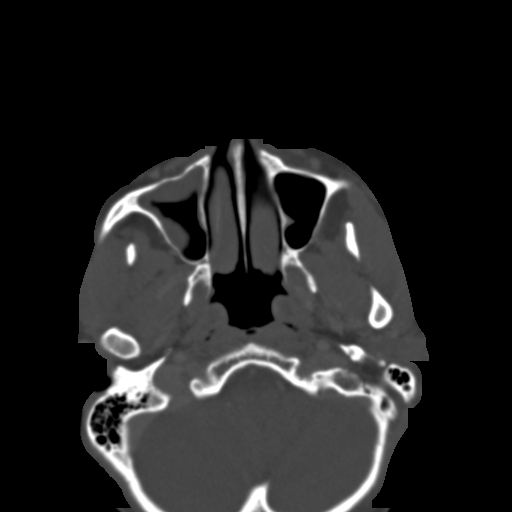
[im 54/92  bone]
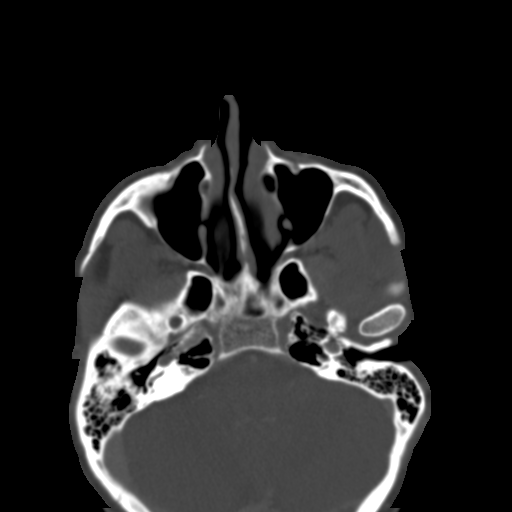
[im 60/92  bone]
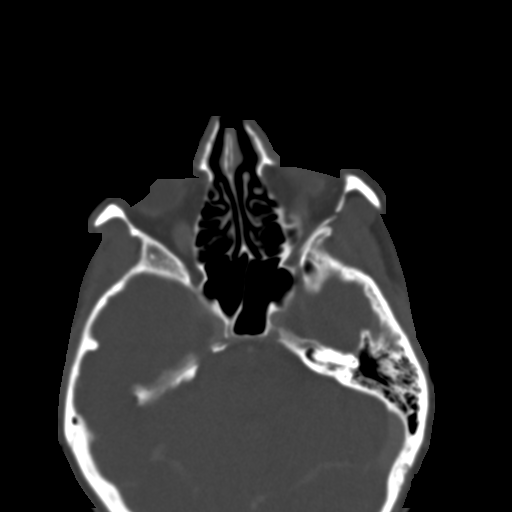
[im 66/92  bone]
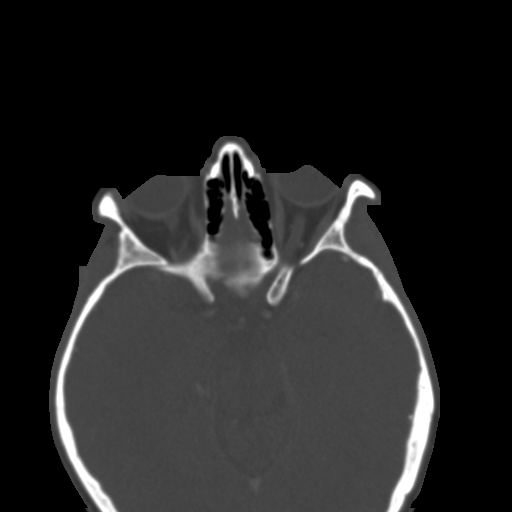
[im 70/92  brain]
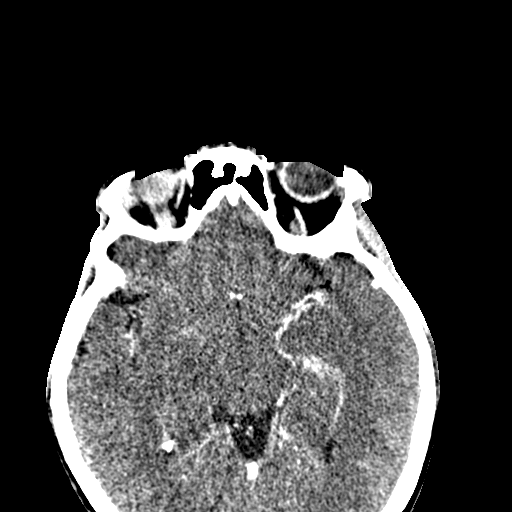
[im 70/92  bone]
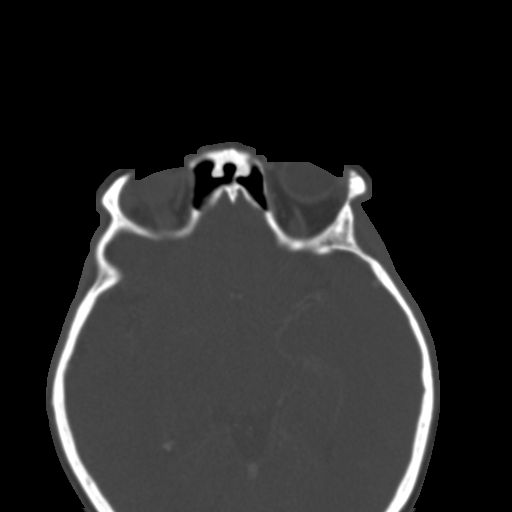
[im 76/92  bone]
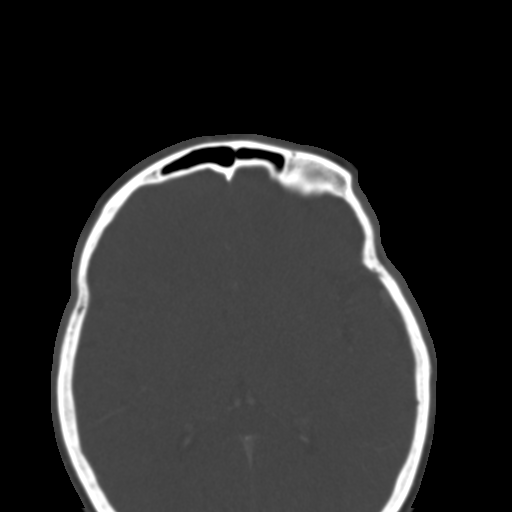
[im 82/92  bone]
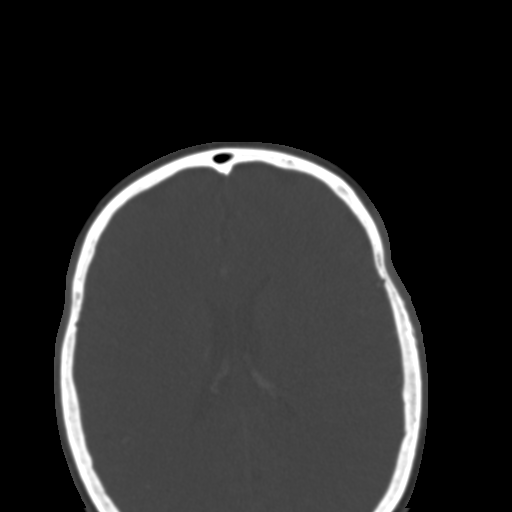
[im 88/92  bone]
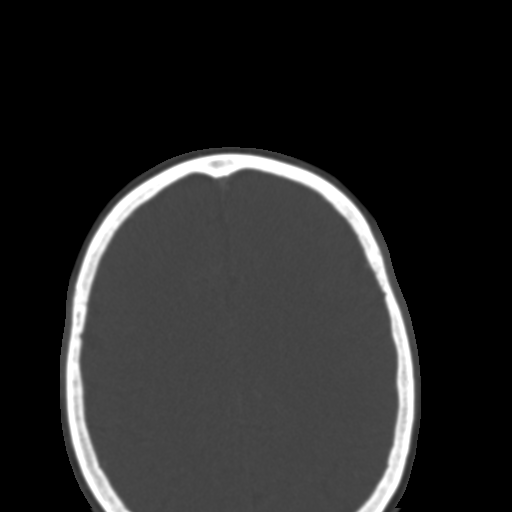

[16 of 30 positions shown; findings below may reference images not displayed]

FINDINGS: OSSEOUS: No acute facial fracture. The mandible is intact, the
condyles are located. No destructive bony lesions. Recent bilateral
mandible molar extraction, RIGHT oral antral fistula.

ORBITS: Ocular globes and orbital contents are normal.

SINUSES: Mild lobulated maxillary sinus mucosal thickening without
paranasal sinus air-fluid levels. Mastoid air cells are well
aerated.

SOFT TISSUES: No significant soft tissue swelling. No subcutaneous
gas or radiopaque foreign bodies.

LIMITED INTRACRANIAL: Normal.
IMPRESSION: 1. Mild maxillary sinusitis with RIGHT oral antral fistula; recent
bilateral mandible molar dental extractions.

## 2018-12-29 MED ORDER — IOPAMIDOL (ISOVUE-300) INJECTION 61%
75.0000 mL | Freq: Once | INTRAVENOUS | Status: AC | PRN
  Administered 2018-12-29: 75 mL via INTRAVENOUS

## 2019-01-05 DIAGNOSIS — R202 Paresthesia of skin: Secondary | ICD-10-CM | POA: Diagnosis not present

## 2019-01-09 ENCOUNTER — Encounter: Payer: Self-pay | Admitting: Neurology

## 2019-01-18 DIAGNOSIS — X398XXD Other exposure to forces of nature, subsequent encounter: Secondary | ICD-10-CM | POA: Diagnosis not present

## 2019-01-18 DIAGNOSIS — R202 Paresthesia of skin: Secondary | ICD-10-CM | POA: Diagnosis not present

## 2019-01-27 NOTE — Progress Notes (Signed)
[facility] Clinic Note - Initial Visit   Date: 01/30/19  Caitlin Pearson [facility] MRN: [id number] DOB: 1966-02-16   Dear Dr. Darron Doom:  Thank you for your kind referral of Caitlin Pearson for consultation of facial paresthesias. Although her history is well known to you, please allow [place] to reiterate it for the purpose of our medical record. The patient was accompanied to the clinic by self.    History of Present Illness: Caitlin Pearson is a 53 y.o. right-handed Caucasian female with PTSD, vitamin D deficiency, and anxiety  presenting for evaluation of facial paresthesia.   In October 2019, she began having change in taste followed by numbness of the tongue, entire face, and scalp.  She also complains of neck pain and difficulty swallowing.  She feels numbness and tingling over both arms.  Symptoms are constant, no specific triggers.  She was worried that her filling were leaking mercury so had them all removed, she was told by her dentist her filling looked like a "toxic waste dump".  She is very anxious and was given given a prescription by her PCP for anxiety, but is not taking it.  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  She was on a lot of antidepressant and anxiety medication in the 1990s, but due to cognitive side effects, has not been on anything since this time.  She denies double vision, limb weakness, or falls.    Out-side paper records, electronic medical record, and images have been reviewed where available and summarized as:  Labs 11/05/2018:  HbA1c 4.7, TSH 1.52, vitamin D 15.9*  Past Medical History:  Diagnosis Date  . PTSD (post-traumatic stress disorder)     Past Surgical History:  Procedure Laterality Date  . ABDOMINAL HYSTERECTOMY    . SHOULDER SURGERY       Medications:  Outpatient Encounter Medications as of 01/30/2019  Medication Sig  . [DISCONTINUED] cyclobenzaprine (FLEXERIL) 10 MG tablet   .  [DISCONTINUED] diclofenac (VOLTAREN) 75 MG EC tablet Take 1 tablet (75 mg total) by mouth 2 (two) times daily.   No facility-administered encounter medications on file as of 01/30/2019.     Allergies:  Allergies  Allergen Reactions  . Penicillins Anaphylaxis and Hives    Family History: Family History  Problem Relation Age of Onset  . Cerebral aneurysm Mother   . COPD Father   . Obesity Brother     Social History: Social History   Tobacco Use  . Smoking status: Never Smoker  . Smokeless tobacco: Never Used  Substance Use Topics  . Alcohol use: No    Comment: 2 times weekly  . Drug use: No   Social History   Social History Narrative  . Not on file    Review of Systems:  CONSTITUTIONAL: No fevers, chills, night sweats, or weight loss.   EYES: No visual changes or eye pain ENT: No hearing changes.  No history of nose bleeds.   RESPIRATORY: No cough, wheezing and shortness of breath.   CARDIOVASCULAR: Negative for chest pain, and palpitations.   GI: Negative for abdominal discomfort, blood in stools or black stools.  No recent change in bowel habits.   GU:  No history of incontinence.   MUSCLOSKELETAL: No history of joint pain or swelling.  No myalgias.   SKIN: Negative for lesions, rash, and itching.   HEMATOLOGY/ONCOLOGY: Negative for prolonged bleeding, bruising easily, and swollen nodes.  No history of cancer.  ENDOCRINE: Negative for cold or heat intolerance, polydipsia or goiter.   PSYCH:  No depression +anxiety symptoms.   NEURO: As Above.   Vital Signs:  BP 100/68   Pulse 94   Ht '5\' 4"'  (1.626 m)   Wt 138 lb (62.6 kg)   SpO2 99%   BMI 23.69 kg/m    General Medical Exam: Gen:  Highly anxious, psychomotor agitation.   Eyes/ENT: see cranial nerve examination.   Neck:   No carotid bruits. Respiratory:  Clear to auscultation, good air entry bilaterally.   Cardiac:  Regular rate and rhythm, no murmur.   Extremities:  No deformities, edema, or skin  discoloration.  Skin:  No rashes or lesions.  Neurological Exam: MENTAL STATUS including orientation to time, place, person, recent and remote memory, attention span and concentration, language, and fund of knowledge is fairly intact.  Speech is not dysarthric.  CRANIAL NERVES: II:  No visual field defects.  Unremarkable fundi.   III-IV-VI: Pupils equal round and reactive to light.  Normal conjugate, extra-ocular eye movements in all directions of gaze.  No nystagmus.  No ptosis.   V:  Normal facial sensation.   Jaw jerk is absent.   VII:  Normal facial symmetry and movements.  Myeron's sign is absent.  VIII:  Normal hearing and vestibular function.   IX-X:  Normal palatal movement.   XI:  Normal shoulder shrug and head rotation.   XII:  Normal tongue strength and range of motion, no deviation or fasciculation.  MOTOR:  Motor strength is 5/5 throughout. No atrophy, fasciculations or abnormal movements.  No pronator drift.   MSRs:  Right                                                                 Left brachioradialis 2+  brachioradialis 2+  biceps 2+  biceps 2+  triceps 2+  triceps 2+  patellar 2+  patellar 2+  ankle jerk 2+  ankle jerk 2+  Hoffman no  Hoffman no  plantar response down  plantar response down   SENSORY:  Normal and symmetric perception of light touch, pinprick, vibration, and proprioception.  Romberg's sign absent.   COORDINATION/GAIT: Normal finger-to- nose-finger.  Intact rapid alternating movements bilaterally. Gait narrow based and stable. Tandem and stressed gait intact.    IMPRESSION: Caitlin Pearson is a 53 year-old female presenting with myriad of symptoms including facial paresthesias involving the entire face, scalp, and distal tongue and bilateral arm paresthesias.  Her exam is non-focal, which is reassuring.  I will check MRI brain wwo contrast to be sure there is no demyelinating disease, however, my overall suspicion is low given the widespread  distribution of paresthesias and lack of supporting exam findings.  Additionally, laboratory testing for vitamin B12, vitamin B1, and folate deficiency will be ordered.  Further recommendations pending results.  Thank you for allowing me to participate in patient's care.  If I can answer any additional questions, I would be pleased to do so.    Sincerely,    Jlynn Ly K. Posey Pronto, DO

## 2019-01-30 ENCOUNTER — Encounter: Payer: Self-pay | Admitting: Neurology

## 2019-01-30 ENCOUNTER — Other Ambulatory Visit (INDEPENDENT_AMBULATORY_CARE_PROVIDER_SITE_OTHER): Payer: BLUE CROSS/BLUE SHIELD

## 2019-01-30 ENCOUNTER — Ambulatory Visit: Payer: BLUE CROSS/BLUE SHIELD | Admitting: Neurology

## 2019-01-30 VITALS — BP 100/68 | HR 94 | Ht 64.0 in | Wt 138.0 lb

## 2019-01-30 DIAGNOSIS — R202 Paresthesia of skin: Secondary | ICD-10-CM

## 2019-01-30 DIAGNOSIS — R209 Unspecified disturbances of skin sensation: Secondary | ICD-10-CM

## 2019-01-30 LAB — VITAMIN B12: Vitamin B-12: >1525 pg/mL — ABNORMAL HIGH (ref 211–911)

## 2019-01-30 LAB — FOLATE: Folate: 7.4 ng/mL (ref >5.9)

## 2019-01-30 NOTE — Patient Instructions (Addendum)
MRI brain wwo contrast  Check labs  We will call you with the results

## 2019-02-04 LAB — VITAMIN B1: Vitamin B1 (Thiamine): 120 nmol/L — ABNORMAL HIGH (ref 8–30)

## 2019-02-05 ENCOUNTER — Ambulatory Visit
Admission: RE | Admit: 2019-02-05 | Discharge: 2019-02-05 | Disposition: A | Discharge status: Home / Self Care | Payer: BLUE CROSS/BLUE SHIELD | Source: Ambulatory Visit | Attending: Neurology | Admitting: Neurology

## 2019-02-05 ENCOUNTER — Other Ambulatory Visit: Payer: Self-pay

## 2019-02-05 DIAGNOSIS — R202 Paresthesia of skin: Secondary | ICD-10-CM

## 2019-02-05 DIAGNOSIS — R200 Anesthesia of skin: Secondary | ICD-10-CM | POA: Diagnosis not present

## 2019-02-05 DIAGNOSIS — R51 Headache: Secondary | ICD-10-CM | POA: Diagnosis not present

## 2019-02-05 DIAGNOSIS — R209 Unspecified disturbances of skin sensation: Principal | ICD-10-CM

## 2019-02-05 IMAGING — MR MRI HEAD WITHOUT AND WITH CONTRAST
12 series · 48 of 48 positions shown · IV contrast (multihance)
Comparison: None.

CLINICAL DATA: Headaches facial numbness. History of traumatic
brain injury in [9Y].

EXAM:
MRI HEAD WITHOUT AND WITH CONTRAST
TECHNIQUE: Multiplanar, multiecho pulse sequences of the brain and surrounding
structures were obtained without and with intravenous contrast.
CONTRAST:  10mL MULTIHANCE GADOBENATE DIMEGLUMINE 529 MG/ML IV SOLN

[Series 2: T1 · sagittal · 5.0mm · 0.45mm/px · 1 of 21 slices shown]
[im 1/21]
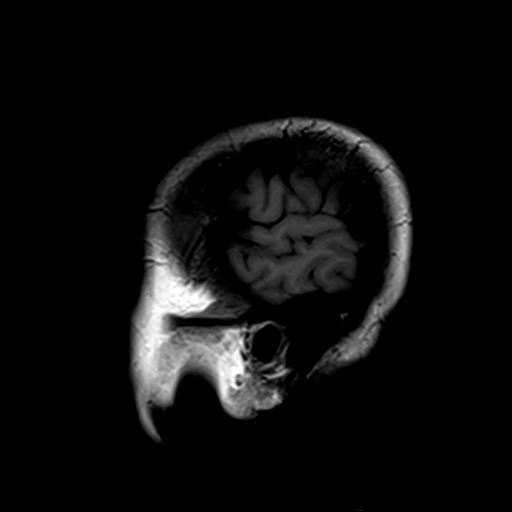

[Series 3: DWI · axial · 3.0mm · 1.80mm/px · z∈[-76,+70]mm · 7 of 100 slices shown (1 of 4)]
[im 1/100]
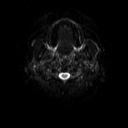
[im 17/100]
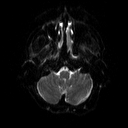
[im 34/100]
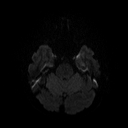
[im 50/100]
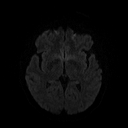
[im 67/100]
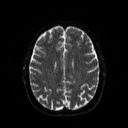
[im 83/100]
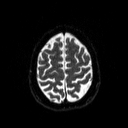
[im 100/100]
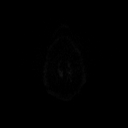

[Series 4: DWI · axial · 3.0mm · 1.80mm/px · z∈[-76,+70]mm · 3 of 48 slices shown (2 of 4)]
[im 1/48]
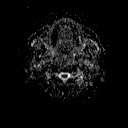
[im 24/48]
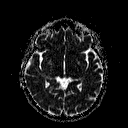
[im 48/48]
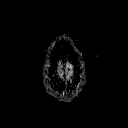

[Series 5: DWI · coronal · 5.0mm · 1.80mm/px · 5 of 67 slices shown (3 of 4)]
[im 1/67]
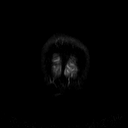
[im 17/67]
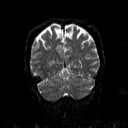
[im 34/67]
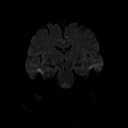
[im 50/67]
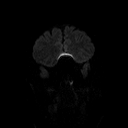
[im 67/67]
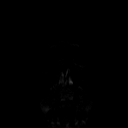

[Series 6: DWI · coronal · 5.0mm · 1.80mm/px · 2 of 34 slices shown (4 of 4)]
[im 1/34]
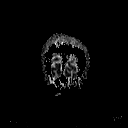
[im 34/34]
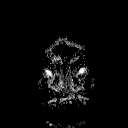

[Series 7: T2 · axial · 5.0mm · 0.60mm/px · z∈[-76,+79]mm · 2 of 24 slices shown (1 of 2)]
[im 1/24]
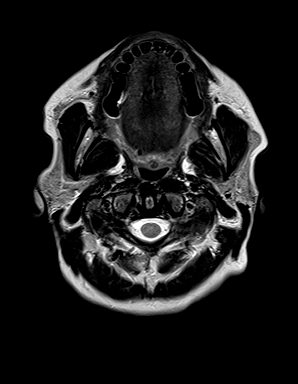
[im 24/24]
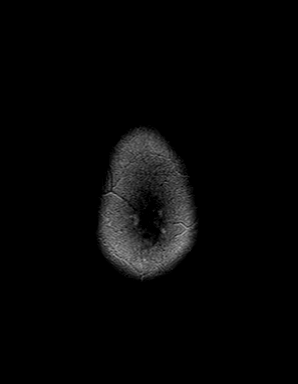

[Series 8: FLAIR · axial · 3.0mm · 0.45mm/px · z∈[-67,+68]mm · 2 of 30 slices shown]
[im 1/30]
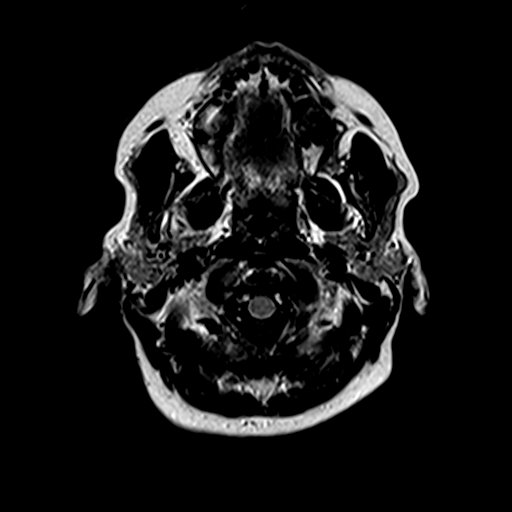
[im 30/30]
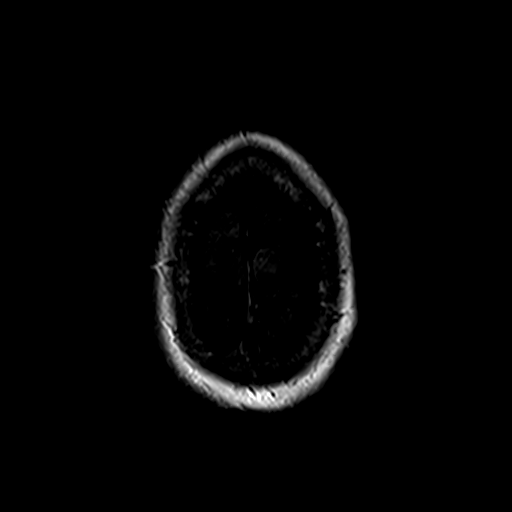

[Series 10: swi_images · axial · 4.0mm · 0.90mm/px · z∈[-69,+71]mm · 2 of 36 slices shown]
[im 1/36]
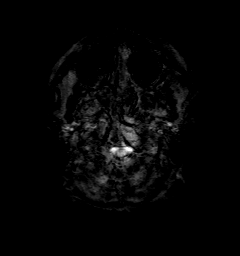
[im 36/36]
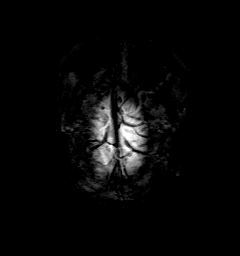

[Series 11: t1_mpr_tra · axial · 1.0mm · 0.75mm/px · z∈[-69,+73]mm · 10 of 144 slices shown (1 of 2)]
[im 1/144]
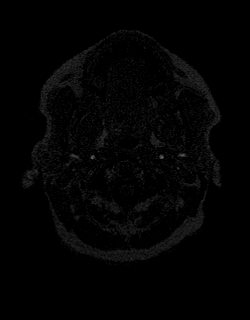
[im 16/144]
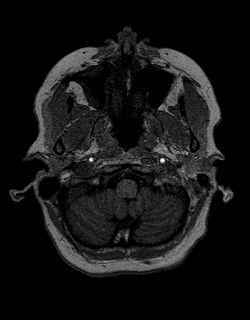
[im 32/144]
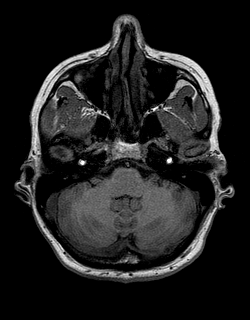
[im 48/144]
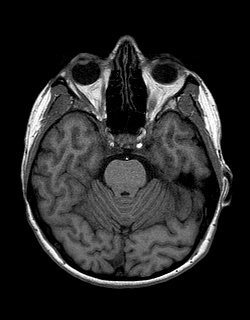
[im 64/144]
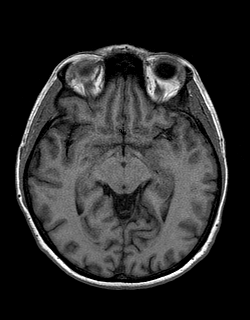
[im 80/144]
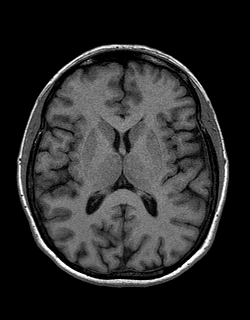
[im 96/144]
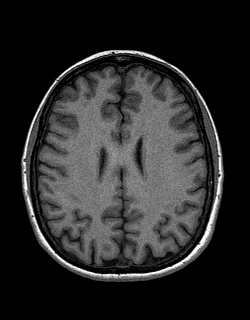
[im 112/144]
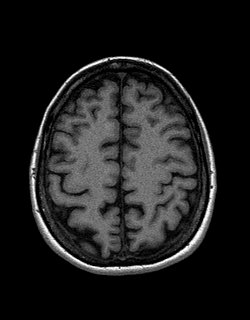
[im 128/144]
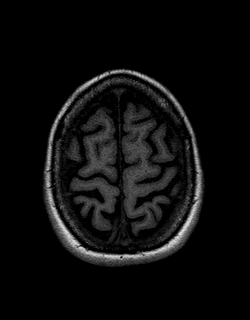
[im 144/144]
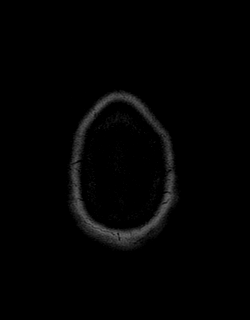

[Series 12: T2 · coronal · 5.0mm · 0.45mm/px · 2 of 25 slices shown (2 of 2)]
[im 1/25]
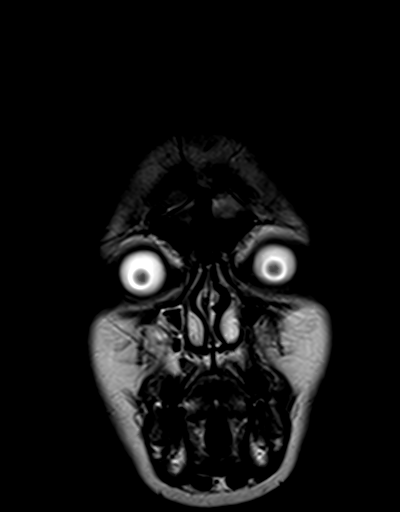
[im 25/25]
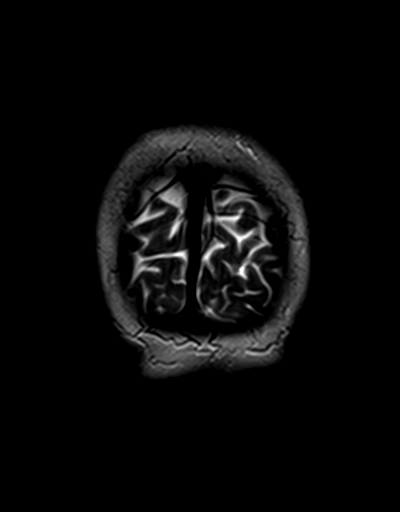

[Series 13: t1_mpr_tra · axial · 1.0mm · 0.75mm/px · z∈[-69,+73]mm · 10 of 144 slices shown (2 of 2)]
[im 1/144]
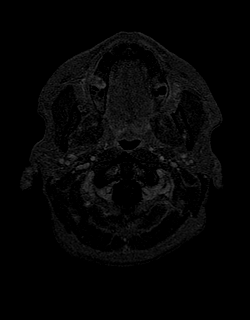
[im 16/144]
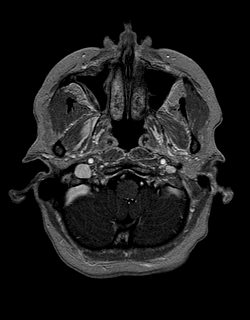
[im 32/144]
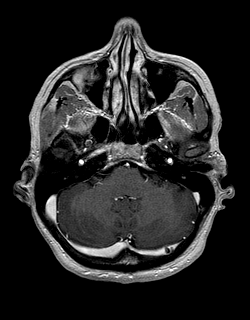
[im 48/144]
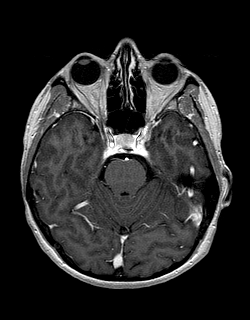
[im 64/144]
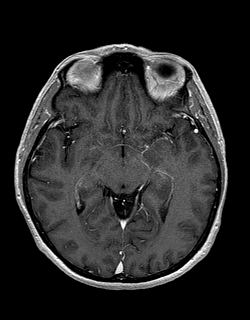
[im 80/144]
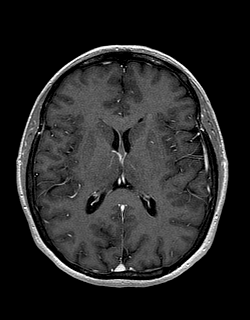
[im 96/144]
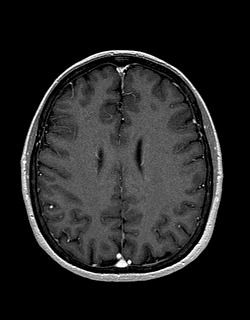
[im 112/144]
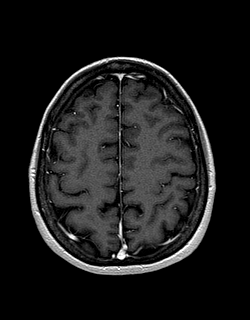
[im 128/144]
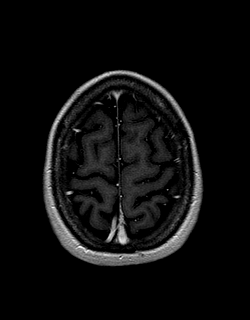
[im 144/144]
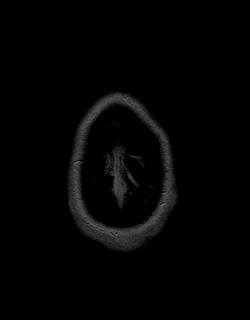

[Series 14: post cor · coronal · 5.0mm · 0.45mm/px · 2 of 25 slices shown]
[im 1/25]
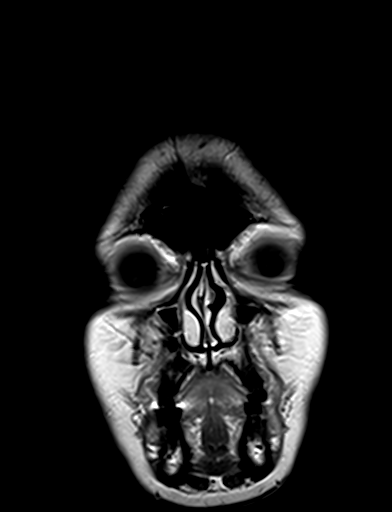
[im 25/25]
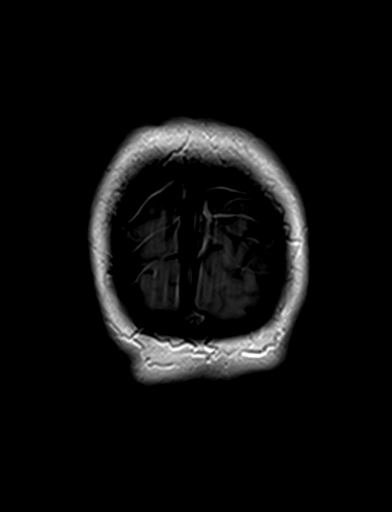

[48 of 48 positions shown; findings below may reference images not displayed]

FINDINGS: BRAIN: There is no acute infarct, acute hemorrhage, hydrocephalus or
extra-axial collection. The midline structures are normal. No
midline shift or other mass effect. Minimal white matter
hyperintensity, nonspecific and commonly seen in asymptomatic
patients of this age. The cerebral and cerebellar volume are
age-appropriate. Susceptibility-sensitive sequences show no chronic
microhemorrhage or superficial siderosis. No abnormal contrast
enhancement.

VASCULAR: Major intracranial arterial and venous sinus flow voids
are normal.

SKULL AND UPPER CERVICAL SPINE: Calvarial bone marrow signal is
normal. There is no skull base mass. Visualized upper cervical spine
and soft tissues are normal.

SINUSES/ORBITS: No fluid levels or advanced mucosal thickening. No
mastoid or middle ear effusion. The orbits are normal.
IMPRESSION: Normal MRI of the brain.

## 2019-02-05 MED ORDER — GADOBENATE DIMEGLUMINE 529 MG/ML IV SOLN
10.0000 mL | Freq: Once | INTRAVENOUS | Status: AC | PRN
  Administered 2019-02-05: 10 mL via INTRAVENOUS

## 2019-02-06 ENCOUNTER — Encounter: Payer: Self-pay | Admitting: *Deleted

## 2019-02-06 ENCOUNTER — Telehealth: Payer: Self-pay | Admitting: *Deleted

## 2019-02-06 NOTE — Telephone Encounter (Signed)
Results sent via My Chart.  

## 2019-02-06 NOTE — Telephone Encounter (Signed)
-----   Message from Alda Berthold, DO sent at 02/06/2019  2:10 PM EDT ----- Please reassure patient that her MRI brain is normal and does not show multiple sclerosis, tumor, or stroke. Stress and anxiety can cause similar symptoms that she is experiencing and with her normal MRI which exclude worrisome causes, I do recommend that she take medication for anxiety as given by her PCP to see if this helps.  Thanks.

## 2019-02-06 NOTE — Telephone Encounter (Signed)
Results and instructions sent via My Chart.  

## 2019-02-06 NOTE — Telephone Encounter (Signed)
-----   Message from Donika K Patel, DO sent at 02/06/2019  9:34 AM EDT ----- Please notify patient lab are within normal limits.  Thank you.  

## 2019-02-09 ENCOUNTER — Telehealth: Payer: Self-pay | Admitting: Neurology

## 2019-02-09 NOTE — Telephone Encounter (Signed)
Patient called regarding her MRI results. Thanks

## 2019-02-09 NOTE — Telephone Encounter (Signed)
Patient called regarding the MRI results

## 2019-02-09 NOTE — Telephone Encounter (Signed)
Patient given results and instructions.   

## 2019-02-27 ENCOUNTER — Ambulatory Visit: Payer: Self-pay | Admitting: Neurology

## 2019-03-20 DIAGNOSIS — M791 Myalgia, unspecified site: Secondary | ICD-10-CM | POA: Diagnosis not present

## 2019-03-20 DIAGNOSIS — R05 Cough: Secondary | ICD-10-CM | POA: Diagnosis not present

## 2019-03-20 DIAGNOSIS — J309 Allergic rhinitis, unspecified: Secondary | ICD-10-CM | POA: Diagnosis not present

## 2019-03-20 DIAGNOSIS — Z2089 Contact with and (suspected) exposure to other communicable diseases: Secondary | ICD-10-CM | POA: Diagnosis not present

## 2019-05-30 DIAGNOSIS — U071 COVID-19: Secondary | ICD-10-CM | POA: Diagnosis not present

## 2019-05-30 DIAGNOSIS — Z2089 Contact with and (suspected) exposure to other communicable diseases: Secondary | ICD-10-CM | POA: Diagnosis not present

## 2020-11-25 DIAGNOSIS — M25512 Pain in left shoulder: Secondary | ICD-10-CM | POA: Insufficient documentation

## 2020-11-27 ENCOUNTER — Other Ambulatory Visit (HOSPITAL_COMMUNITY): Payer: Self-pay | Admitting: Orthopedic Surgery

## 2020-11-27 DIAGNOSIS — M25512 Pain in left shoulder: Secondary | ICD-10-CM

## 2020-11-29 ENCOUNTER — Ambulatory Visit (HOSPITAL_COMMUNITY): Payer: 59

## 2020-12-03 ENCOUNTER — Ambulatory Visit (HOSPITAL_COMMUNITY)
Admission: RE | Admit: 2020-12-03 | Discharge: 2020-12-03 | Disposition: A | Discharge status: Home / Self Care | Payer: 59 | Source: Ambulatory Visit | Attending: Orthopedic Surgery | Admitting: Orthopedic Surgery

## 2020-12-03 ENCOUNTER — Other Ambulatory Visit: Payer: Self-pay

## 2020-12-03 DIAGNOSIS — M25512 Pain in left shoulder: Secondary | ICD-10-CM | POA: Insufficient documentation

## 2020-12-03 IMAGING — MR MR SHOULDER*L* W/O CM
6 series · 40 of 40 positions shown · non-contrast
Comparison: None.

CLINICAL DATA: Chronic left shoulder pain. Prior shoulder
surgeries.

EXAM:
MRI OF THE LEFT SHOULDER WITHOUT CONTRAST
TECHNIQUE: Multiplanar, multisequence MR imaging of the shoulder was performed.
No intravenous contrast was administered.

[Series 6: T2 fat-sat · axial · left · 4.0mm · 0.55mm/px · z∈[-90,+14]mm · 7 of 27 slices shown (1 of 4)]
[im 1/27]
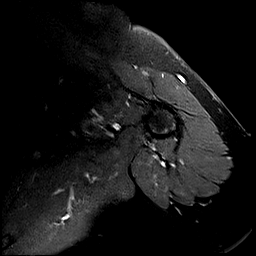
[im 5/27]
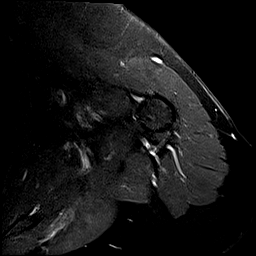
[im 9/27]
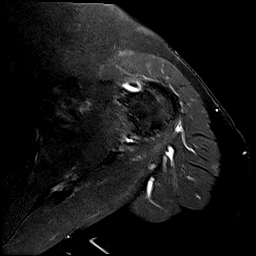
[im 14/27]
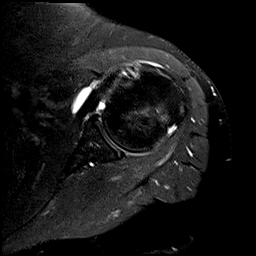
[im 18/27]
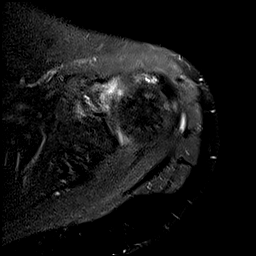
[im 22/27]
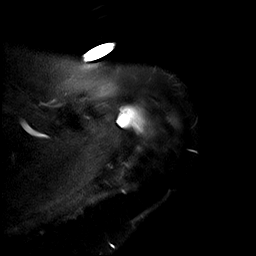
[im 27/27]
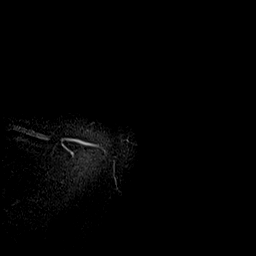

[Series 7: T2 fat-sat · oblique · left · 4.0mm · 0.44mm/px · 5 of 20 slices shown (2 of 4)]
[im 1/20]
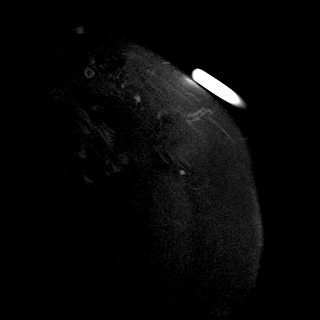
[im 5/20]
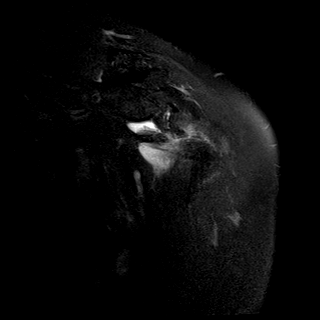
[im 10/20]
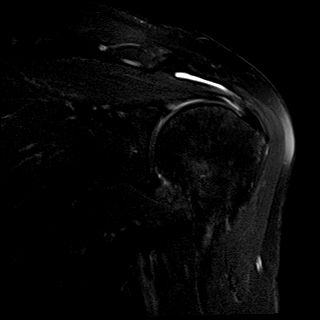
[im 15/20]
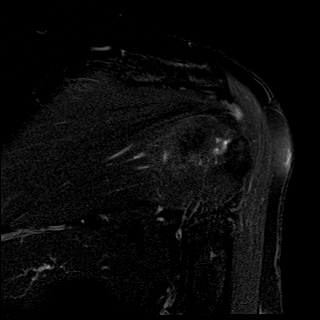
[im 20/20]
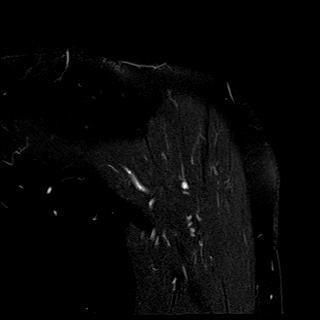

[Series 8: PD · oblique · left · 4.0mm · 0.50mm/px · 5 of 20 slices shown]
[im 1/20]
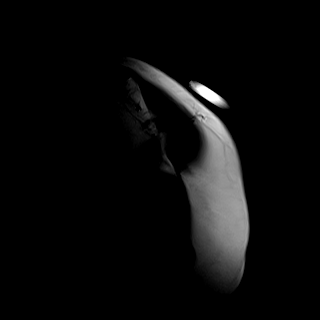
[im 5/20]
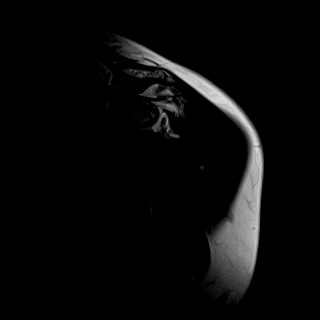
[im 10/20]
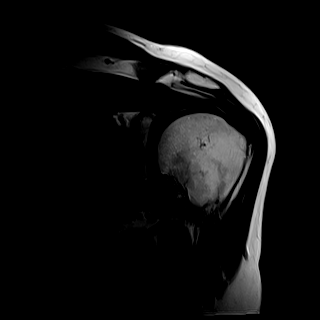
[im 15/20]
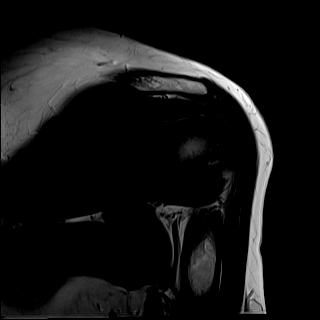
[im 20/20]
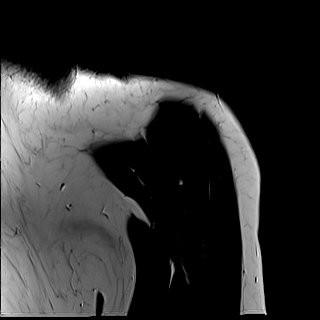

[Series 9: T1 · sagittal · left · 3.0mm · 0.55mm/px · 8 of 30 slices shown]
[im 1/30]
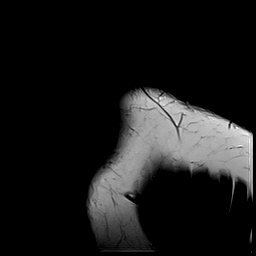
[im 5/30]
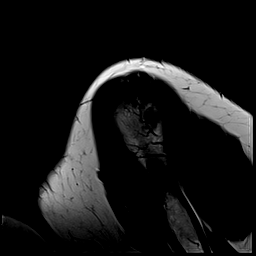
[im 9/30]
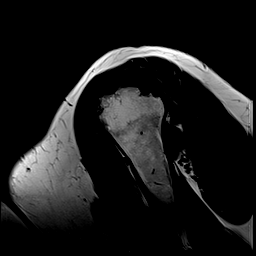
[im 13/30]
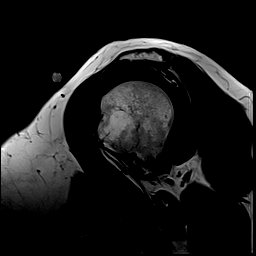
[im 17/30]
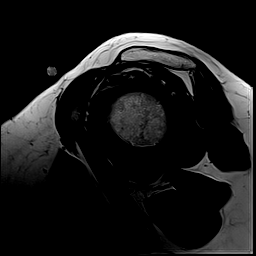
[im 21/30]
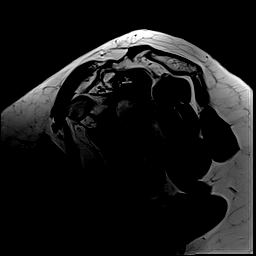
[im 25/30]
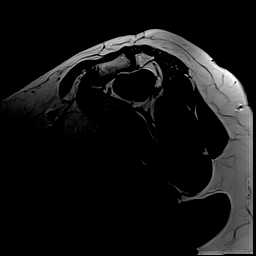
[im 30/30]
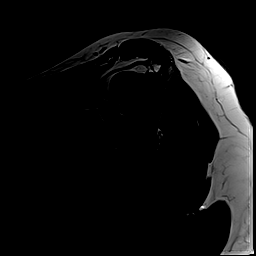

[Series 10: T2 fat-sat · sagittal · left · 3.0mm · 0.59mm/px · 8 of 30 slices shown (3 of 4)]
[im 1/30]
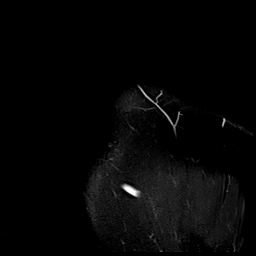
[im 5/30]
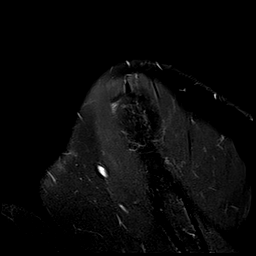
[im 9/30]
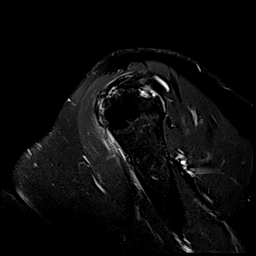
[im 13/30]
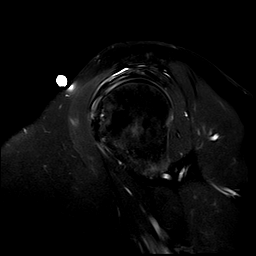
[im 17/30]
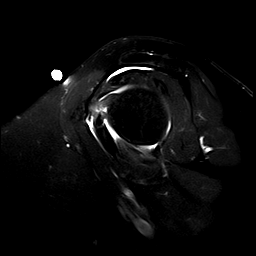
[im 21/30]
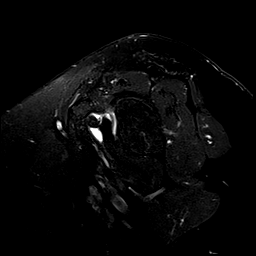
[im 25/30]
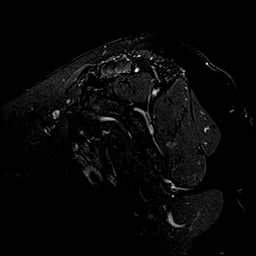
[im 30/30]
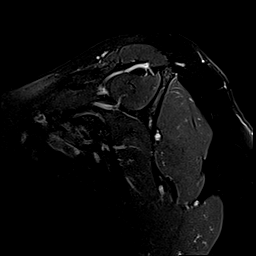

[Series 11: T2 fat-sat · axial · left · 4.0mm · 0.47mm/px · z∈[-85,+19]mm · 7 of 27 slices shown (4 of 4)]
[im 1/27]
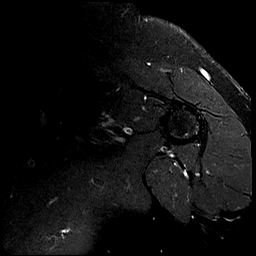
[im 5/27]
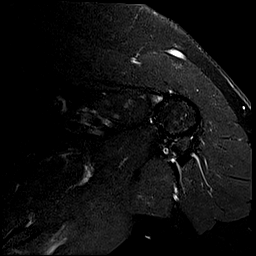
[im 9/27]
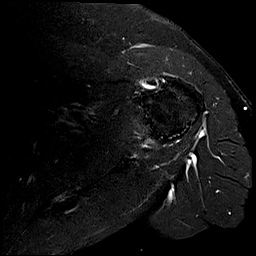
[im 14/27]
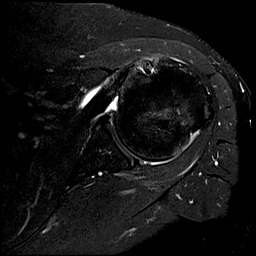
[im 18/27]
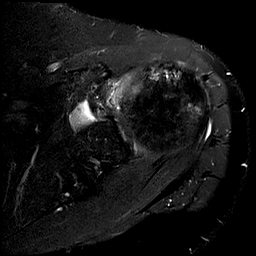
[im 22/27]
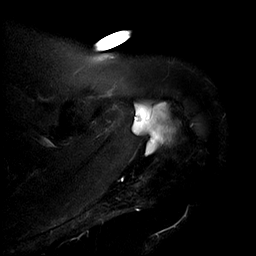
[im 27/27]
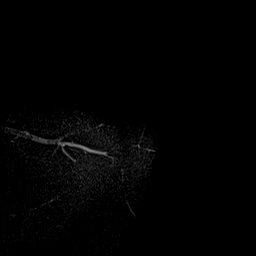

[40 of 40 positions shown; findings below may reference images not displayed]

FINDINGS: Rotator cuff: Partial thickness articular surface tearing of the
distal supraspinatus tendon especially anteriorly, causing thinning
of the tendon as shown on images 8 through 10 of series 7. I not
observe a definite full-thickness tear.

Moderate supraspinatus and mild infraspinatus and subscapularis
tendinopathy.

Muscles:  Unremarkable

Biceps long head: Moderate tendinopathy of the intra-articular
segment.

Acromioclavicular Joint: Distal clavicular resection. Type II
acromion. Mild subacromial subdeltoid bursitis and subcoracoid
bursitis.

Glenohumeral Joint: Minimal degenerative chondral thinning.

Labrum:  Grossly unremarkable

Bones: Small degenerative subcortical cystic lesions posteriorly
along the humeral head near the anatomic neck.

Other: No supplemental non-categorized findings.
IMPRESSION: 1. Partial thickness articular surface tearing of the distal
supraspinatus tendon especially anteriorly, causing thinning of the
tendon. No definite full-thickness tear identified.
2. Moderate supraspinatus and mild infraspinatus and subscapularis
tendinopathy.
3. Moderate tendinopathy of the intra-articular segment of the long
head of the biceps.
4. Mild subacromial subdeltoid bursitis and subcoracoid bursitis.
5. Minimal degenerative chondral thinning in the glenohumeral joint.
6. Distal clavicular resection.

## 2021-08-20 ENCOUNTER — Other Ambulatory Visit: Payer: Self-pay | Admitting: Family Medicine

## 2021-08-20 DIAGNOSIS — W19XXXA Unspecified fall, initial encounter: Secondary | ICD-10-CM

## 2021-08-30 ENCOUNTER — Other Ambulatory Visit: Payer: 59

## 2021-09-03 ENCOUNTER — Ambulatory Visit
Admission: RE | Admit: 2021-09-03 | Discharge: 2021-09-03 | Disposition: A | Discharge status: Home / Self Care | Payer: 59 | Source: Ambulatory Visit | Attending: Family Medicine | Admitting: Family Medicine

## 2021-09-03 DIAGNOSIS — W19XXXA Unspecified fall, initial encounter: Secondary | ICD-10-CM

## 2021-09-03 IMAGING — CT CT MAXILLOFACIAL W/O CM
1 series · 16 of 30 positions shown, 20 images · non-contrast
Comparison: [DATE]

CLINICAL DATA: Left-sided trauma related to fall 3 weeks ago

EXAM:
CT MAXILLOFACIAL WITHOUT CONTRAST
TECHNIQUE: Multidetector CT imaging of the maxillofacial structures was
performed. Multiplanar CT image reconstructions were also generated.

[Series 4: face soft · axial · 0.33mm/px · z∈[-180,-34]mm · 16 of 79 slices shown, 20 images]
[im 3/79  brain]
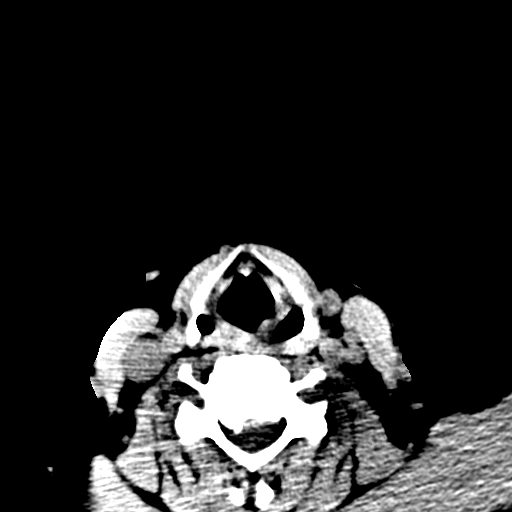
[im 3/79  bone]
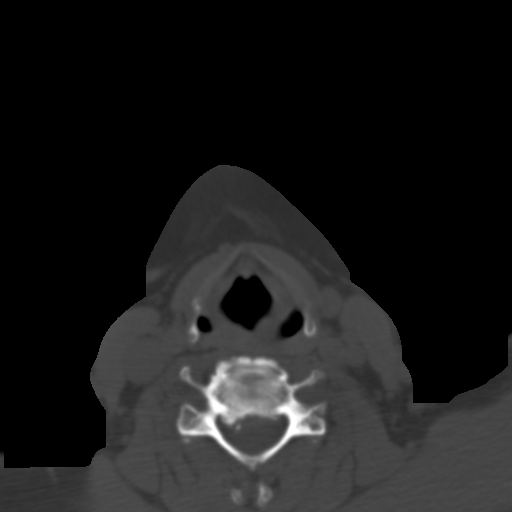
[im 9/79  bone]
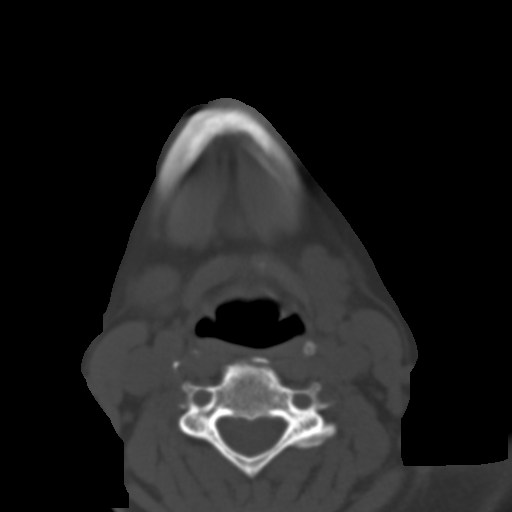
[im 14/79  bone]
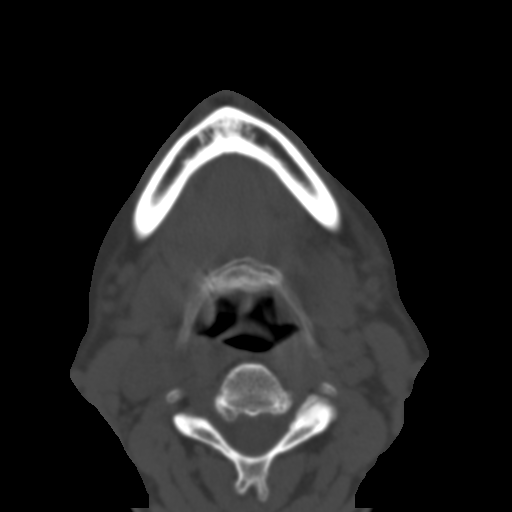
[im 19/79  bone]
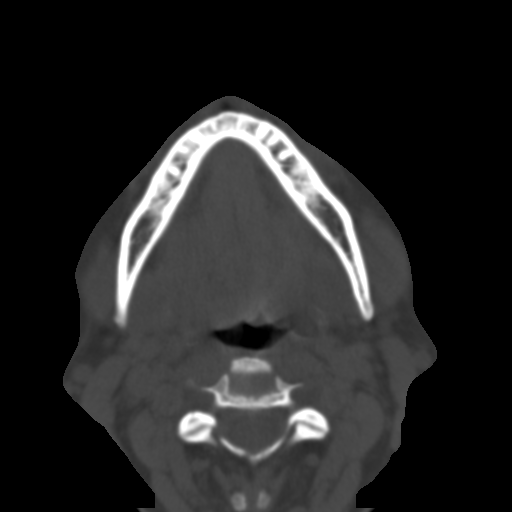
[im 22/79  brain]
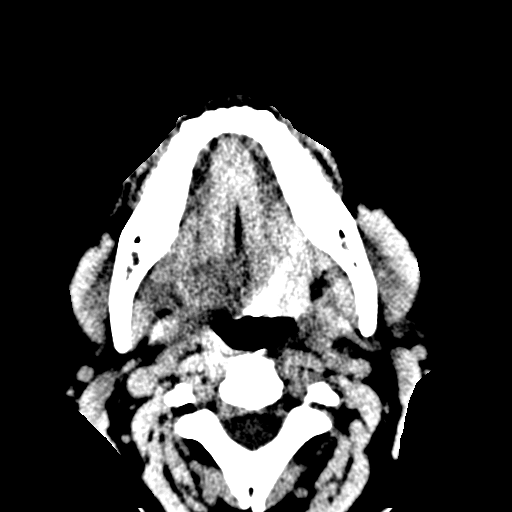
[im 22/79  bone]
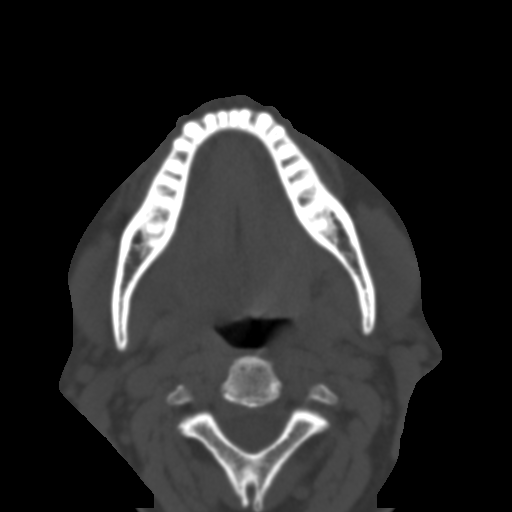
[im 27/79  bone]
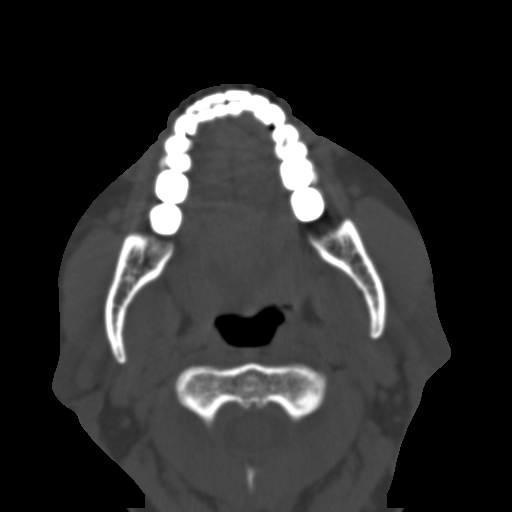
[im 33/79  bone]
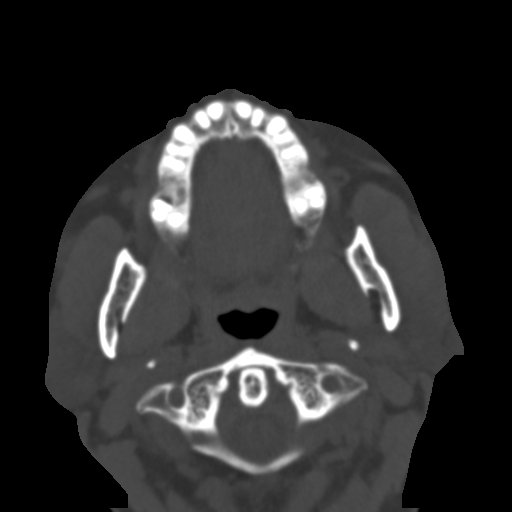
[im 38/79  bone]
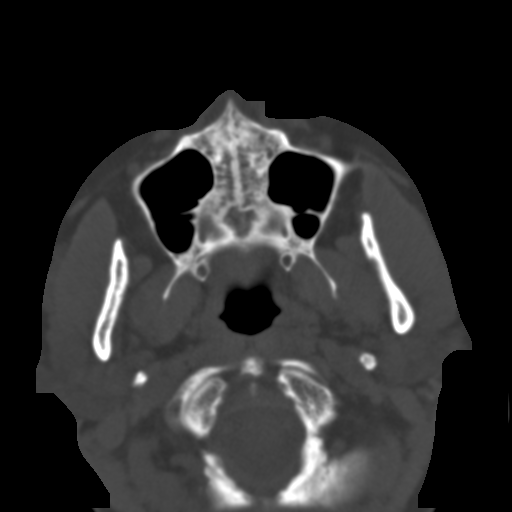
[im 41/79  brain]
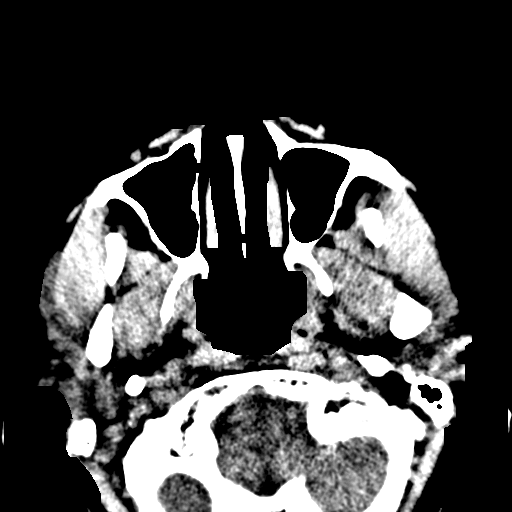
[im 41/79  bone]
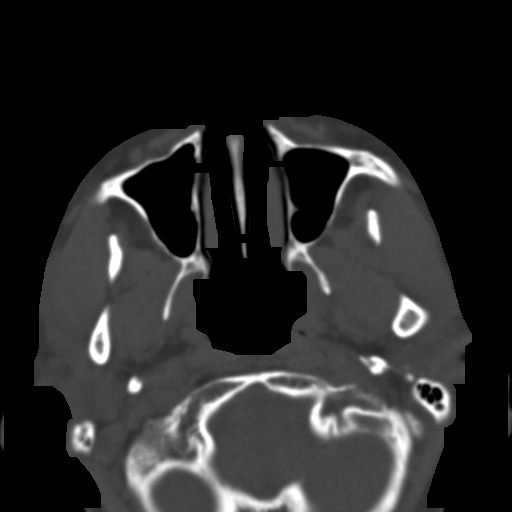
[im 46/79  bone]
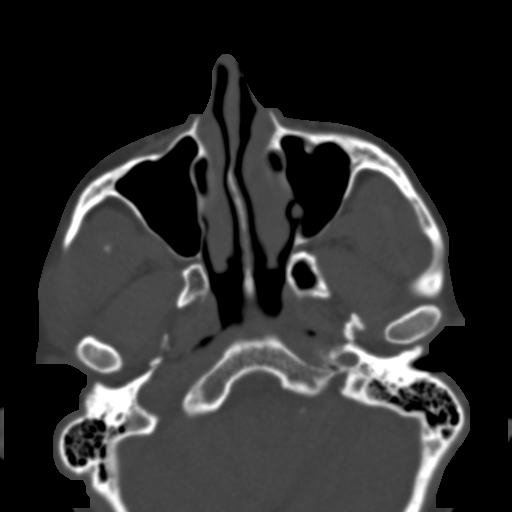
[im 52/79  bone]
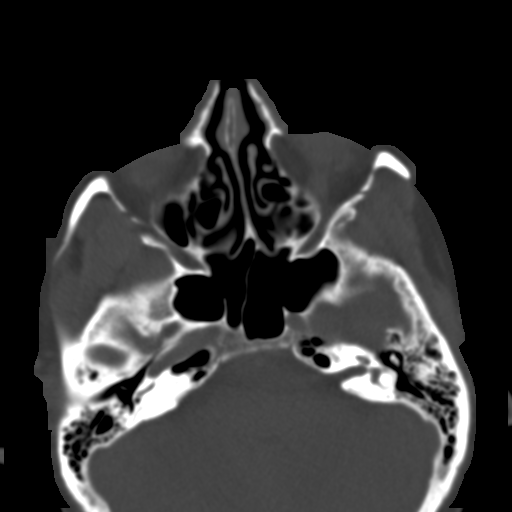
[im 57/79  bone]
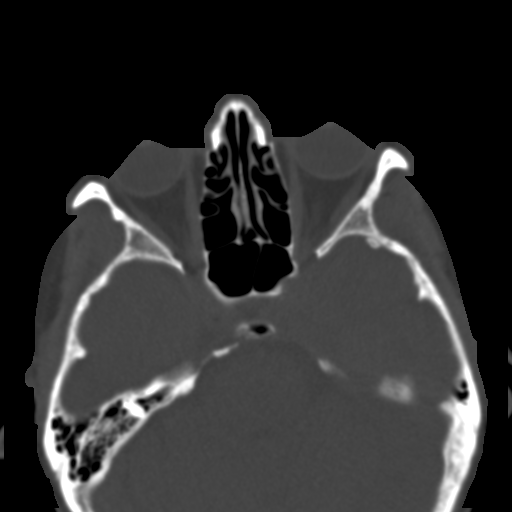
[im 60/79  brain]
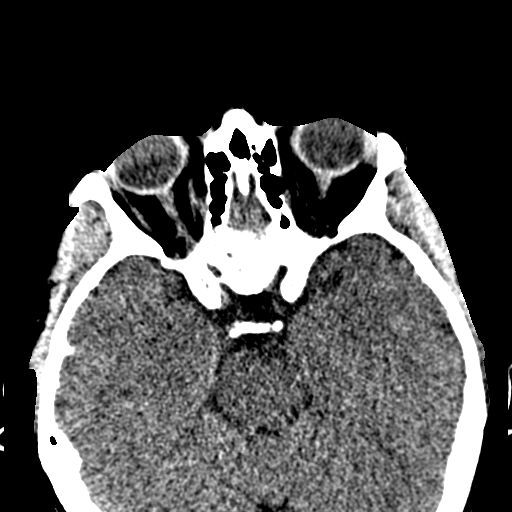
[im 60/79  bone]
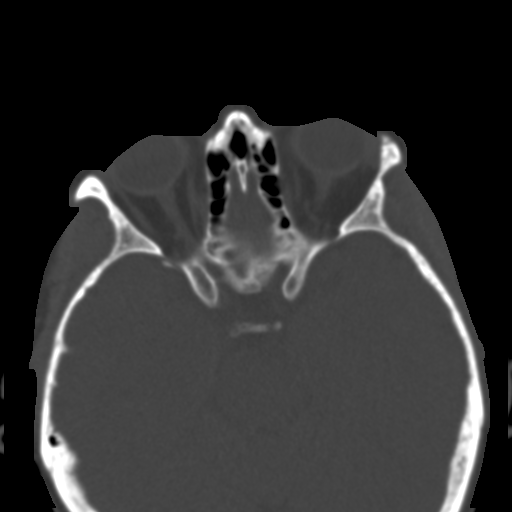
[im 65/79  bone]
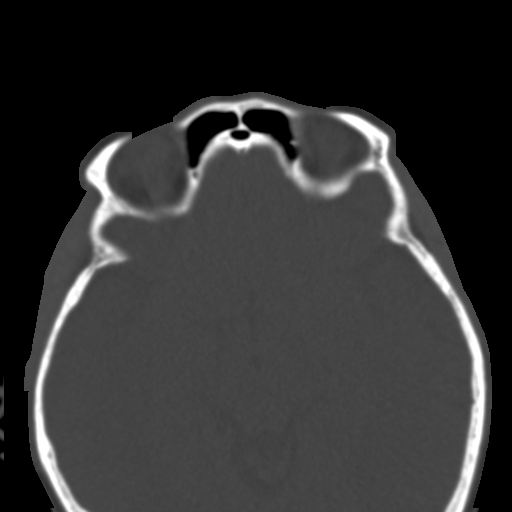
[im 70/79  bone]
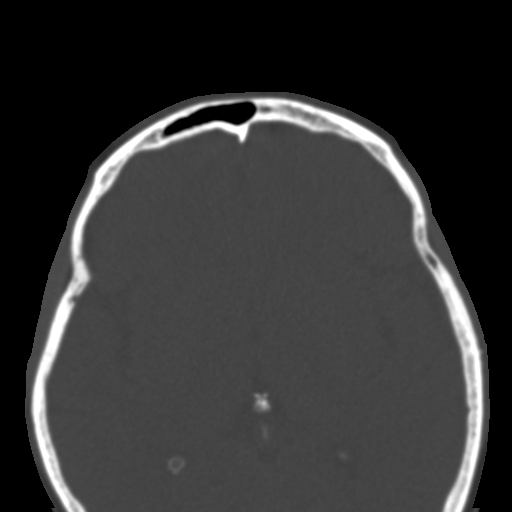
[im 76/79  bone]
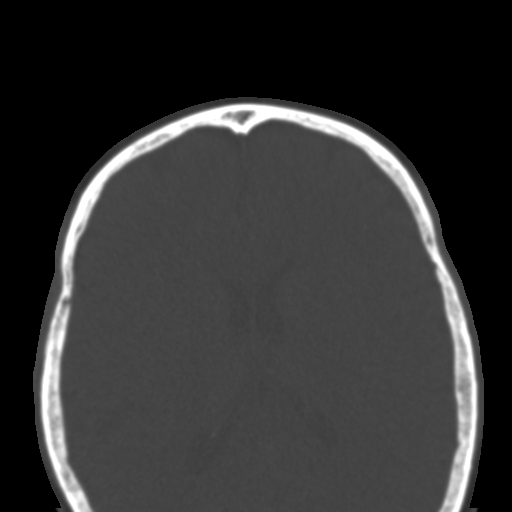

[16 of 30 positions shown; findings below may reference images not displayed]

FINDINGS: Osseous: No fracture or mandibular dislocation. No destructive
process.

Notable degeneration of the upper cervical spine for age with
uncovertebral spurs encroaching on bilateral foramina at C3-4.

Orbits: Negative. No traumatic or inflammatory finding.

Sinuses: Clear.

Soft tissues: No hematoma or opaque foreign body

Limited intracranial: No visible injury.
IMPRESSION: No visible injury.  Negative for facial fracture.

## 2021-09-04 ENCOUNTER — Other Ambulatory Visit: Payer: 59

## 2021-09-05 ENCOUNTER — Other Ambulatory Visit: Payer: 59

## 2021-11-13 DIAGNOSIS — M25571 Pain in right ankle and joints of right foot: Secondary | ICD-10-CM | POA: Insufficient documentation

## 2022-01-12 ENCOUNTER — Ambulatory Visit (INDEPENDENT_AMBULATORY_CARE_PROVIDER_SITE_OTHER): Payer: 59 | Admitting: Nurse Practitioner

## 2022-01-12 ENCOUNTER — Other Ambulatory Visit: Payer: Self-pay

## 2022-01-12 ENCOUNTER — Encounter: Payer: Self-pay | Admitting: Nurse Practitioner

## 2022-01-12 VITALS — BP 109/74 | HR 69 | Wt 142.7 lb

## 2022-01-12 DIAGNOSIS — R6882 Decreased libido: Secondary | ICD-10-CM | POA: Diagnosis not present

## 2022-01-12 DIAGNOSIS — N951 Menopausal and female climacteric states: Secondary | ICD-10-CM

## 2022-01-12 NOTE — Progress Notes (Signed)
GYNECOLOGY OFFICE VISIT NOTE   History:  56 y.o. No obstetric history on file. here today for regulation of hormones.  Sees Dr. Loralie Champagne.  Has been experiencing hot flashes and decreased libido.  Had partial hysterectomy several years ago but now that she is menopausal, she is having symptoms.  Dr. Loralie Champagne drew hormone levels and tried one compounded hormonal replacement cream - patient had a severe allergic reaction after one week.  She had a severe rash covering both thighs. This took several medications to abate - pepcid was one medication she took.  Then she was referred to this office for GYN care.  She denies any abnormal vaginal discharge, bleeding, pelvic pain or other concerns.  She had pap and mammogram within the last 6 months although results are not available in [facility].  Will obtain ROI for lab results and office notes.  Past Medical History:  Diagnosis Date   PTSD (post-traumatic stress disorder)     Past Surgical History:  Procedure Laterality Date   ABDOMINAL HYSTERECTOMY     APPENDECTOMY     SHOULDER SURGERY     TONSILLECTOMY      The following portions of the patient's history were reviewed and updated as appropriate: allergies, current medications, past family history, past medical history, past social history, past surgical history and problem list.   Health Maintenance:  Normal pap on approx 05-2021.  Normal mammogram on approx July 2022 per patient.   Review of Systems:  Pertinent items noted in HPI and remainder of comprehensive ROS otherwise negative.  Objective:  Physical Exam BP 109/74    Pulse 69    Wt 142 lb 11.2 oz (64.7 kg)    BMI 24.49 kg/m  CONSTITUTIONAL: Well-developed, well-nourished female in no acute distress.  HENT:  Normocephalic, atraumatic. External right and left ear normal.  EYES: Conjunctivae and EOM are normal. Pupils are equal, round.  No scleral icterus.  NECK: Normal range of motion, supple,  SKIN: Skin is warm and dry. No  rash noted. Not diaphoretic. No erythema. No pallor. NEUROLOGIC: Alert and oriented to person, place, and time. Normal muscle tone coordination. No cranial nerve deficit noted. PSYCHIATRIC: Normal mood and affect. Normal behavior. Normal judgment and thought content. CARDIOVASCULAR: Normal heart rate noted RESPIRATORY: Effort and breath sounds normal, no problems with respiration noted ABDOMEN: Soft, no distention noted.   PELVIC: Deferred MUSCULOSKELETAL: Normal range of motion. No edema noted.  Labs and Imaging No results found.  Assessment & Plan:  1. Hot flashes due to menopause Discussed using effexor as first line treatment for hot flashes.  Results usually obtained in 5-6 weeks and patient declined this approach.  She is only interested in compounded hormone replacement although we do not have lab results to review for this.  And this provider is not experienced in this type of hormone replacement.  Will see if we can get her to a provider who can assist her.  This office is primarily OB focused care.  Sent message to Dr. Sabra Heck at [facility] to see patient and appointment made for Dr. Sabra Heck.  2.  Low libido Advised this is a complex problem and perhaps the Effexor could help with both symptoms, but she is not interested in taking this medication.  Routine preventative health maintenance measures emphasized. Please refer to After Visit Summary for other counseling recommendations.   Return in about 2 weeks (around 01/26/2022) for Make an appointment with Dr. Sabra Heck at [facility]   Total face-to-face time with  patient: 20 minutes.  Over 50% of encounter was spent on counseling and coordination of care.  Earlie Server, RN, MSN, NP-BC Nurse Practitioner, [facility] for [employer], [facility] 01/12/2022 1:15 PM

## 2022-01-21 ENCOUNTER — Other Ambulatory Visit: Payer: Self-pay

## 2022-01-21 ENCOUNTER — Encounter: Payer: Self-pay | Admitting: Obstetrics & Gynecology

## 2022-01-21 ENCOUNTER — Ambulatory Visit: Payer: 59 | Admitting: Obstetrics & Gynecology

## 2022-01-21 VITALS — BP 106/64 | HR 70 | Resp 16 | Ht 63.25 in | Wt 141.0 lb

## 2022-01-21 DIAGNOSIS — N951 Menopausal and female climacteric states: Secondary | ICD-10-CM | POA: Diagnosis not present

## 2022-01-21 DIAGNOSIS — Z90710 Acquired absence of both cervix and uterus: Secondary | ICD-10-CM

## 2022-01-21 MED ORDER — EST ESTROGENS-METHYLTEST 1.25-2.5 MG PO TABS: 1.0000 | ORAL_TABLET | Freq: Every day | ORAL | 4 refills | Status: DC

## 2022-01-21 NOTE — Progress Notes (Signed)
? ? ?  Mize 1966-07-09 [id number] ? ? ?     56 y.o.  G2P0A2 ? ?RP: Counseling on HRT ? ?HPI: Menopausal with severe hot flushes and night sweats.  Decreased libido.  S/P Total Hysterectomy.  Normal breasts.  Last mammo Neg.  No personal h/o stroke or PE. ? ? ?OB History  ?Gravida Para Term Preterm AB Living  ?2       2    ?SAB IAB Ectopic Multiple Live Births  ?  2        ?  ?# Outcome Date GA Lbr Len/2nd Weight Sex Delivery Anes PTL Lv  ?2 IAB           ?1 IAB           ? ? ?Past medical history,surgical history, problem list, medications, allergies, family history and social history were all reviewed and documented in the EPIC chart. ? ? ?Directed ROS with pertinent positives and negatives documented in the history of present illness/assessment and plan. ? ?Exam: ? ?Vitals:  ? 01/21/22 1350  ?BP: 106/64  ?Pulse: 70  ?Resp: 16  ?Weight: 141 lb (64 kg)  ?Height: 5' 3.25" (1.607 m)  ? ?General appearance:  Normal ? ? ?Gynecologic exam: Deferred ? ? ?Assessment/Plan:  56 y.o. G2P0020  ? ?1. Menopausal syndrome ?Menopausal with severe hot flushes and night sweats.  Decreased libido.  S/P Total Hysterectomy.  Normal breasts.  Last mammo Neg.  No personal h/o stroke or PE.  Counseling on HRT with different treatment options discussed.  Benefits and risks reviewed.  Benefits on cardiovascular system, skin, bones, sexual activities and brain function.  Risks of stroke and PE increasing with age and the increased risk of Breast Ca after 10 yrs of HRT discussed.  Decision to start on EstraTest 1.25-2.5/tab 1 tab PO daily.  Prescription sent to pharmacy. ? ?2. S/P total hysterectomy ? ?Other orders ?- IBUPROFEN PO; Take by mouth as needed. ?- estrogens-methylTEST (ESTRATEST) 1.25-2.5 MG tablet; Take 1 tablet by mouth daily.  ? ?Princess Bruins MD, 2:05 PM 01/21/2022 ? ? ? ?  ?

## 2022-01-27 ENCOUNTER — Encounter: Payer: Self-pay | Admitting: Obstetrics & Gynecology

## 2022-01-29 ENCOUNTER — Telehealth: Payer: Self-pay

## 2022-01-29 NOTE — Telephone Encounter (Signed)
Patient called in voice mail stating her Friday insurance plan will not cover Estratest Rx. Patient stated she needs [place] to figure out a way for her to get this paid for. ? ? I called patient but received her voice mail and did not have DPR access note on file to leave detailed message.  Was going to advise her that she should call her insurance plan and see if there is anything our office can do to help her get it paid for. If not, it may just simply not be on their formulary and she will need to ask them how to locate the drug formulary or what is covered.  I left message for her to call back tomorrow since phones are off today. ?

## 2022-02-03 NOTE — Telephone Encounter (Signed)
Patient returned the phone call. I called her back and received voicemail, message left for patient to call. ?

## 2022-02-03 NOTE — Telephone Encounter (Signed)
Patient called stating Estatest 1.25-2.5 mg tablet is $117 too expensive for her. She asked if you could prescribed another medication? Patient is aware you are not aware of the cost of medications. Please advise  ?

## 2022-02-04 MED ORDER — ESTRADIOL 1 MG PO TABS: 1.0000 mg | ORAL_TABLET | Freq: Every day | ORAL | 4 refills | Status: DC

## 2022-02-04 MED ORDER — NONFORMULARY OR COMPOUNDED ITEM: 0 refills | Status: DC

## 2022-02-04 NOTE — Telephone Encounter (Signed)
Dr.Lavoie "My suggestion would be to send Estradiol 1 mg tab PO daily.  Can add Testosterone cream for low libido.  Testo 2% 2 clicks daily to clitoris or inner thighs.   " ? ? ? ? ?Patient aware rx sent for estradiol 1 mg to [facility]. Testosterone called into [facility] per patient request. ?

## 2022-02-04 NOTE — Telephone Encounter (Signed)
Left message for patient to call.

## 2022-07-06 ENCOUNTER — Ambulatory Visit: Payer: 59 | Admitting: Obstetrics & Gynecology

## 2022-07-20 ENCOUNTER — Encounter: Payer: Self-pay | Admitting: Obstetrics & Gynecology

## 2022-07-20 ENCOUNTER — Ambulatory Visit (INDEPENDENT_AMBULATORY_CARE_PROVIDER_SITE_OTHER): Payer: 59 | Admitting: Obstetrics & Gynecology

## 2022-07-20 VITALS — BP 110/78

## 2022-07-20 DIAGNOSIS — N951 Menopausal and female climacteric states: Secondary | ICD-10-CM

## 2022-07-20 DIAGNOSIS — Z90710 Acquired absence of both cervix and uterus: Secondary | ICD-10-CM | POA: Diagnosis not present

## 2022-07-20 MED ORDER — ESTRADIOL 0.05 MG/24HR TD PTTW: 1.0000 | MEDICATED_PATCH | TRANSDERMAL | 4 refills | Status: DC

## 2022-07-20 NOTE — Progress Notes (Signed)
    Reiffton 03/11/1966 [id number]        56 y.o.  G2P0020   RP: Change of HRT for menopausal syndrome  HPI: Change of HRT for menopausal syndrome.  Currently on Estradiol 1 mg tab PO daily.  Would like to change to the patch.  S/P Total Hysterectomy.  Breasts normal.  Last mammo Neg 03/2021.  Needs to schedule mammo, but had Friday Insurance which dropped all their clients.  Will schedule as soon as she transfers to [facility].  No h/o stroke or DVT/PE.   OB History  Gravida Para Term Preterm AB Living  2       2    SAB IAB Ectopic Multiple Live Births    2          # Outcome Date GA Lbr Len/2nd Weight Sex Delivery Anes PTL Lv  2 IAB           1 IAB             Past medical history,surgical history, problem list, medications, allergies, family history and social history were all reviewed and documented in the EPIC chart.   Directed ROS with pertinent positives and negatives documented in the history of present illness/assessment and plan.  Exam:  Vitals:   07/20/22 1634  BP: 110/78   General appearance:  Normal  Breast exam:  Normal.  No nodule or mass.  Axillae normal.   Assessment/Plan:  56 y.o. G2P0020   1. Menopausal syndrome Change of HRT for menopausal syndrome.  Currently on Estradiol 1 mg tab PO daily.  Would like to change to the patch.  S/P Total Hysterectomy.  Breasts normal.  Last mammo Neg 03/2021.  Needs to schedule mammo, but had Friday Insurance which dropped all their clients.  Will schedule as soon as she transfers to [facility].  No h/o stroke or DVT/PE.  Normal breast exam today.  Stop Estradiol 1 mg tab daily.  Start Estradiol patch 0.05 twice a week.  Usage reviewed.  No CI.  Prescription sent to pharmacy.  2. S/P total hysterectomy  Other orders - estradiol (VIVELLE-DOT) 0.05 MG/24HR patch; Place 1 patch (0.05 mg total) onto the skin 2 (two) times a week.   Princess Bruins MD, 4:40 PM 07/20/2022

## 2022-08-12 ENCOUNTER — Ambulatory Visit: Payer: 59 | Admitting: Obstetrics & Gynecology

## 2022-09-18 ENCOUNTER — Ambulatory Visit: Payer: 59 | Admitting: Obstetrics & Gynecology

## 2022-10-05 ENCOUNTER — Ambulatory Visit: Payer: 59 | Admitting: Obstetrics & Gynecology

## 2022-10-27 ENCOUNTER — Ambulatory Visit: Payer: 59 | Admitting: Obstetrics & Gynecology

## 2022-11-25 ENCOUNTER — Encounter: Payer: Self-pay | Admitting: Obstetrics & Gynecology

## 2022-11-25 ENCOUNTER — Ambulatory Visit (INDEPENDENT_AMBULATORY_CARE_PROVIDER_SITE_OTHER): Payer: BC Managed Care – PPO | Admitting: Obstetrics & Gynecology

## 2022-11-25 VITALS — BP 116/76 | HR 65 | Ht 63.25 in | Wt 145.0 lb

## 2022-11-25 DIAGNOSIS — Z90710 Acquired absence of both cervix and uterus: Secondary | ICD-10-CM

## 2022-11-25 DIAGNOSIS — Z79890 Hormone replacement therapy (postmenopausal): Secondary | ICD-10-CM

## 2022-11-25 DIAGNOSIS — Z01419 Encounter for gynecological examination (general) (routine) without abnormal findings: Secondary | ICD-10-CM

## 2022-11-25 MED ORDER — ESTRADIOL 0.05 MG/24HR TD PTTW: 1.0000 | MEDICATED_PATCH | TRANSDERMAL | 4 refills | Status: DC

## 2022-11-25 NOTE — Progress Notes (Signed)
Nelson 1966-04-20 [id number]   History:    57 y.o. G2P0A2  RP: Established patient for annual gyn exam  HPI:  Postmenopausal.  Would like to start back on Estradiol patch 0.05 twice a week. S/P Total Hysterectomy.  Pap Neg in 2023 per patient.  Not sexually active.  Normal breasts.  Last mammo Neg in 2022, will schedule now at the [facility].  ColoGard Neg in 2023 per patient.  Declines the Flu vaccine.  Health labs with Fam MD.   Past medical history,surgical history, family history and social history were all reviewed and documented in the EPIC chart.  Gynecologic History No LMP recorded. Patient has had a hysterectomy.  Obstetric History OB History  Gravida Para Term Preterm AB Living  2       2    SAB IAB Ectopic Multiple Live Births    2          # Outcome Date GA Lbr Len/2nd Weight Sex Delivery Anes PTL Lv  2 IAB           1 IAB              ROS: A ROS was performed and pertinent positives and negatives are included in the history. GENERAL: No fevers or chills. HEENT: No change in vision, no earache, sore throat or sinus congestion. NECK: No pain or stiffness. CARDIOVASCULAR: No chest pain or pressure. No palpitations. PULMONARY: No shortness of breath, cough or wheeze. GASTROINTESTINAL: No abdominal pain, nausea, vomiting or diarrhea, melena or bright red blood per rectum. GENITOURINARY: No urinary frequency, urgency, hesitancy or dysuria. MUSCULOSKELETAL: No joint or muscle pain, no back pain, no recent trauma. DERMATOLOGIC: No rash, no itching, no lesions. ENDOCRINE: No polyuria, polydipsia, no heat or cold intolerance. No recent change in weight. HEMATOLOGICAL: No anemia or easy bruising or bleeding. NEUROLOGIC: No headache, seizures, numbness, tingling or weakness. PSYCHIATRIC: No depression, no loss of interest in normal activity or change in sleep pattern.     Exam:   BP 116/76   Pulse 65   Ht 5' 3.25" (1.607 m)   Wt 145 lb (65.8 kg)   SpO2 99%   BMI  25.48 kg/m   Body mass index is 25.48 kg/m.  General appearance : Well developed well nourished female. No acute distress HEENT: Eyes: no retinal hemorrhage or exudates,  Neck supple, trachea midline, no carotid bruits, no thyroidmegaly Lungs: Clear to auscultation, no rhonchi or wheezes, or rib retractions  Heart: Regular rate and rhythm, no murmurs or gallops Breast:Examined in sitting and supine position were symmetrical in appearance, no palpable masses or tenderness,  no skin retraction, no nipple inversion, no nipple discharge, no skin discoloration, no axillary or supraclavicular lymphadenopathy Abdomen: no palpable masses or tenderness, no rebound or guarding Extremities: no edema or skin discoloration or tenderness  Pelvic: Vulva: Normal             Vagina: No gross lesions or discharge  Cervix/Uterus absent  Adnexa  Without masses or tenderness  Anus: Normal   Assessment/Plan:  57 y.o. female for annual exam   1. Well female exam with routine gynecological exam Postmenopausal.  Would like to start back on Estradiol patch 0.05 twice a week. S/P Total Hysterectomy.  Pap Neg in 2023 per patient.  Not sexually active.  Normal breasts.  Last mammo Neg in 2022, will schedule now at the [facility].  ColoGard Neg in 2023 per patient.  Declines the Flu vaccine.  Health labs with Fam MD.  2. Postmenopausal hormone replacement therapy Postmenopausal.  Would like to start back on Estradiol patch 0.05 twice a week. S/P Total Hysterectomy.  No CI to HRT.  Prescription sent to pharmacy.  3. S/P total hysterectomy  Other orders - estradiol (VIVELLE-DOT) 0.05 MG/24HR patch; Place 1 patch (0.05 mg total) onto the skin 2 (two) times a week.   Princess Bruins MD, 3:19 PM

## 2023-12-07 ENCOUNTER — Other Ambulatory Visit: Payer: Self-pay | Admitting: Family Medicine

## 2023-12-07 DIAGNOSIS — Z1231 Encounter for screening mammogram for malignant neoplasm of breast: Secondary | ICD-10-CM

## 2023-12-09 ENCOUNTER — Ambulatory Visit: Payer: Self-pay

## 2023-12-23 ENCOUNTER — Ambulatory Visit
Admission: RE | Admit: 2023-12-23 | Discharge: 2023-12-23 | Disposition: A | Discharge status: Home / Self Care | Payer: Medicaid Other | Source: Ambulatory Visit | Attending: Family Medicine | Admitting: Family Medicine

## 2023-12-23 DIAGNOSIS — Z1231 Encounter for screening mammogram for malignant neoplasm of breast: Secondary | ICD-10-CM

## 2023-12-24 ENCOUNTER — Ambulatory Visit: Payer: Medicaid Other

## 2024-01-10 ENCOUNTER — Encounter: Payer: Self-pay | Admitting: Obstetrics and Gynecology

## 2024-01-10 ENCOUNTER — Ambulatory Visit (INDEPENDENT_AMBULATORY_CARE_PROVIDER_SITE_OTHER): Payer: Medicaid Other | Admitting: Obstetrics and Gynecology

## 2024-01-10 VITALS — BP 132/84 | HR 86 | Ht 63.75 in | Wt 142.0 lb

## 2024-01-10 DIAGNOSIS — M2550 Pain in unspecified joint: Secondary | ICD-10-CM

## 2024-01-10 DIAGNOSIS — Z01419 Encounter for gynecological examination (general) (routine) without abnormal findings: Secondary | ICD-10-CM | POA: Diagnosis not present

## 2024-01-10 DIAGNOSIS — N952 Postmenopausal atrophic vaginitis: Secondary | ICD-10-CM | POA: Diagnosis not present

## 2024-01-10 MED ORDER — ESTRADIOL 0.0375 MG/24HR TD PTTW: 1.0000 | MEDICATED_PATCH | TRANSDERMAL | 12 refills | Status: DC

## 2024-01-10 MED ORDER — INTRAROSA 6.5 MG VA INST: 1.0000 | VAGINAL_INSERT | Freq: Every evening | VAGINAL | 12 refills | Status: AC | PRN

## 2024-01-10 NOTE — Progress Notes (Signed)
 58 y.o. y.o. female here for annual exam. No LMP recorded. Patient has had a hysterectomy.     [id number]  RP: Established patient for annual gyn exam  HPI:  Postmenopausal.  Would like to start back on Estradiol patch 0.037 twice a week. S/P Total Hysterectomy.  Pap Neg in 2023 per patient.  Not sexually active.  Normal breasts.  Last [facility] 12/23/23, [facility]  ColoGard Neg in 2023 per patient.  Declines the Flu vaccine.  Health labs with Fam MD.  H/o TBI Also reports insomnia and decreased libido. She would like get estrogen and testosterone. To send in intrarosa along with patch.  Discussed possible gabapentin if not working and needing help with sleep.  She does not want melatonin or ambien at this time. Also would like a referral for hand specialist for trigger finger and to rheumatology for joint pain.  There is no height or weight on file to calculate BMI.      No data to display          There were no vitals taken for this visit.  No results found for: "DIAGPAP", "HPVHIGH", "ADEQPAP"  GYN HISTORY: No results found for: "DIAGPAP", "HPVHIGH", "ADEQPAP"  OB History  Gravida Para Term Preterm AB Living  2    2   SAB IAB Ectopic Multiple Live Births   2       # Outcome Date GA Lbr Len/2nd Weight Sex Type Anes PTL Lv  2 IAB           1 IAB             Past Medical History:  Diagnosis Date   Fibroid    PTSD (post-traumatic stress disorder)     Past Surgical History:  Procedure Laterality Date   ABDOMINAL HYSTERECTOMY     APPENDECTOMY     SHOULDER SURGERY     TONSILLECTOMY      Current Outpatient Medications on File Prior to Visit  Medication Sig Dispense Refill   estradiol (VIVELLE-DOT) 0.05 MG/24HR patch Place 1 patch (0.05 mg total) onto the skin 2 (two) times a week. 24 patch 4   NONFORMULARY OR COMPOUNDED ITEM Testosterone 2 % cream 2 clicks daily to clitoris or inner thighs. (Patient not taking: Reported on 11/25/2022) 30 each  0   No current facility-administered medications on file prior to visit.    Social History   Socioeconomic History   Marital status: Single    Spouse name: Not on file   Number of children: 0   Years of education: 16   Highest education level: Bachelor's degree (e.g., BA, AB, BS)  Occupational History   Occupation: sales  Tobacco Use   Smoking status: Former    Current packs/day: 0.00    Types: Cigarettes    Quit date: 11/23/2000    Years since quitting: 23.1   Smokeless tobacco: Never  Vaping Use   Vaping status: Never Used  Substance and Sexual Activity   Alcohol use: Yes    Comment: occ   Drug use: No   Sexual activity: Not Currently    Partners: Female, Female    Birth control/protection: Surgical    Comment: hysterectomy  Other Topics Concern   Not on file  Social History Narrative   She works in Airline pilot.   Highest level of education:  BS (pre-med).   She lives alone, no children.    Social Drivers of Corporate investment banker  Strain: Not on file  Food Insecurity: Not on file  Transportation Needs: Not on file  Physical Activity: Not on file  Stress: Not on file  Social Connections: Not on file  Intimate Partner Violence: Not on file    Family History  Problem Relation Age of Onset   Stroke Mother    Cerebral aneurysm Mother    Dementia Mother    COPD Father      Allergies  Allergen Reactions   Penicillins Anaphylaxis and Hives   Other Hives    compounded hormone       Patient's last menstrual period was No LMP recorded. Patient has had a hysterectomy..             Review of Systems Alls systems reviewed and are negative.     Physical Exam Constitutional:      Appearance: Normal appearance.  Genitourinary:     Vulva and urethral meatus normal.     No lesions in the vagina.     Right Labia: No rash, lesions or skin changes.    Left Labia: No lesions, skin changes or rash.    No vaginal discharge or tenderness.     No vaginal  prolapse present.    Mild vaginal atrophy present.     Right Adnexa: not tender and no mass present.    Left Adnexa: not tender and no mass present.    No cervical discharge.     Uterus is absent.  Breasts:    Right: Normal.     Left: Normal.  HENT:     Head: Normocephalic.  Neck:     Thyroid: No thyroid mass, thyromegaly or thyroid tenderness.  Cardiovascular:     Rate and Rhythm: Normal rate and regular rhythm.     Heart sounds: Normal heart sounds, S1 normal and S2 normal.  Pulmonary:     Effort: Pulmonary effort is normal.     Breath sounds: Normal breath sounds and air entry.  Abdominal:     General: There is no distension.     Palpations: Abdomen is soft. There is no mass.     Tenderness: There is no abdominal tenderness. There is no guarding or rebound.  Musculoskeletal:        General: Normal range of motion.     Cervical back: Full passive range of motion without pain, normal range of motion and neck supple. No tenderness.     Right lower leg: No edema.     Left lower leg: No edema.  Neurological:     Mental Status: She is alert.  Skin:    General: Skin is warm.  Psychiatric:        Mood and Affect: Mood normal.        Behavior: Behavior normal.        Thought Content: Thought content normal.  Vitals and nursing note reviewed. Exam conducted with a chaperone present.       A:         Well Woman GYN exam                             P:        Pap smear  Encouraged annual mammogram screening Colon cancer screening up-to-date DXA not indicated Labs and immunizations to do with PMD Encouraged healthy lifestyle practices Encouraged Vit D and Calcium  Counseled on HRT therapy r/b/a/I. She will try to get coupon online for the  intrarosa. To begin patch twice weekly.  Discussed this may be increased if working and she would like the increase. No follow-ups on file.  Earley Favor

## 2024-01-11 ENCOUNTER — Other Ambulatory Visit (HOSPITAL_COMMUNITY)
Admission: RE | Admit: 2024-01-11 | Discharge: 2024-01-11 | Disposition: A | Discharge status: Home / Self Care | Payer: Medicaid Other | Source: Ambulatory Visit | Attending: Obstetrics and Gynecology | Admitting: Obstetrics and Gynecology

## 2024-01-11 DIAGNOSIS — Z01419 Encounter for gynecological examination (general) (routine) without abnormal findings: Secondary | ICD-10-CM | POA: Diagnosis present

## 2024-01-11 NOTE — Addendum Note (Signed)
 Addended by: Earley Favor on: 01/11/2024 09:17 AM   Modules accepted: Orders

## 2024-01-13 ENCOUNTER — Encounter: Payer: Self-pay | Admitting: Obstetrics and Gynecology

## 2024-01-13 LAB — CYTOLOGY - PAP
Comment: NEGATIVE
Diagnosis: NEGATIVE
High risk HPV: NEGATIVE

## 2024-01-14 ENCOUNTER — Telehealth: Payer: Self-pay

## 2024-01-14 NOTE — Telephone Encounter (Signed)
 Prior authorization was done for dotti patch per insurance. Insurance then sent a message saying that PA was not needed. I contacted the pharmacy & let them know her insurance said to reprocess her rx & to use a coverable NDC. They stated they will call her insurance to get the [facility] they need. I left a message on patients voicemail letting her know.

## 2024-05-08 ENCOUNTER — Telehealth: Payer: Self-pay | Admitting: Obstetrics and Gynecology

## 2024-05-08 DIAGNOSIS — E2839 Other primary ovarian failure: Secondary | ICD-10-CM

## 2024-05-08 NOTE — Telephone Encounter (Signed)
 Baseline bone scan

## 2024-05-15 ENCOUNTER — Telehealth (HOSPITAL_BASED_OUTPATIENT_CLINIC_OR_DEPARTMENT_OTHER): Payer: Self-pay

## 2024-06-05 ENCOUNTER — Ambulatory Visit: Payer: Medicaid Other | Attending: Internal Medicine | Admitting: Internal Medicine

## 2024-06-05 ENCOUNTER — Encounter: Payer: Self-pay | Admitting: Internal Medicine

## 2024-06-05 VITALS — BP 110/70 | HR 57 | Resp 16 | Ht 65.0 in | Wt 139.0 lb

## 2024-06-05 DIAGNOSIS — M19042 Primary osteoarthritis, left hand: Secondary | ICD-10-CM | POA: Insufficient documentation

## 2024-06-05 DIAGNOSIS — M720 Palmar fascial fibromatosis [Dupuytren]: Secondary | ICD-10-CM | POA: Diagnosis not present

## 2024-06-05 DIAGNOSIS — M65342 Trigger finger, left ring finger: Secondary | ICD-10-CM | POA: Diagnosis present

## 2024-06-05 DIAGNOSIS — I7300 Raynaud's syndrome without gangrene: Secondary | ICD-10-CM | POA: Diagnosis present

## 2024-06-05 DIAGNOSIS — M19041 Primary osteoarthritis, right hand: Secondary | ICD-10-CM | POA: Diagnosis not present

## 2024-06-05 NOTE — Progress Notes (Unsigned)
 Office Visit Note  Patient: Caitlin Pearson [facility]             Date of Birth: [birth year]           MRN: [id number]             PCP: Burney Darice CROME, MD Referring: Glennon Almarie POUR, MD Visit Date: 06/05/2024   Subjective:  New Patient (Initial Visit) (Trigger finger in the ring and middle finger on both hands. )   Discussed the use of AI scribe software for clinical note transcription with the patient, who gave verbal consent to proceed.  History of Present Illness   Caitlin Pearson is a 58 year old female who presents with hand pain and stiffness with left palm nodules and fingers triggering.  She has been experiencing pain and stiffness in her hands, particularly affecting her left ring finger, for approximately two years. Symptoms include pain, a sensation of the finger getting 'stuck', and issues with range of motion. Her symptoms are influenced by weather conditions, with increased discomfort during humid conditions. Over the past year, the severity of her symptoms has increased. Initially, she attempted to manage the pain by using a splint, which exacerbated her symptoms. Keeping her fingers moving, such as during a recent road trip, seems to alleviate the discomfort.  She has Raynaud's phenomenon, which she has experienced since around 2011. She describes episodes where her fingers turn white in response to cold, with pain when blood flow returns. This condition also affects her feet.  She is not taking any specific medication for arthritis but is currently on estrogen and occasionally testosterone. She also takes supplements such as omega-3 and turmeric.  Her family history includes an aunt with scleroderma. Her mother had Alzheimer's and possibly arthritis, but details are unclear.  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 She quit smoking two years ago and maintains a clean diet.     Activities of Daily Living:  Patient reports morning stiffness for  none.   Patient Denies  nocturnal pain.  Difficulty dressing/grooming: Denies Difficulty climbing stairs: Denies Difficulty getting out of chair: Denies Difficulty using hands for taps, buttons, cutlery, and/or writing: Reports  Review of Systems  Constitutional:  Negative for fatigue.  HENT:  Negative for mouth sores and mouth dryness.   Eyes:  Negative for dryness.  Respiratory:  Negative for shortness of breath.   Cardiovascular:  Negative for chest pain and palpitations.  Gastrointestinal:  Negative for blood in stool, constipation and diarrhea.  Endocrine: Negative for increased urination.  Genitourinary:  Negative for involuntary urination.  Musculoskeletal:  Negative for joint pain, gait problem, joint pain, joint swelling, myalgias, muscle weakness, morning stiffness, muscle tenderness and myalgias.  Skin:  Negative for color change, rash, hair loss and sensitivity to sunlight.  Allergic/Immunologic: Negative for susceptible to infections.  Neurological:  Negative for dizziness and headaches.  Hematological:  Negative for swollen glands.  Psychiatric/Behavioral:  Positive for sleep disturbance. Negative for depressed mood. The patient is not nervous/anxious.     PMFS History:  Patient Active Problem List   Diagnosis Date Noted  . Dupuytren contracture of left hand 06/05/2024  . Trigger finger, left ring finger 06/05/2024  . Osteoarthritis of both hands 06/05/2024  . Raynaud's syndrome without gangrene 06/05/2024  . Arthralgia of right ankle 11/13/2021  . Pain in joint of left shoulder 11/25/2020  . Strain of right hamstring 02/21/2018  . Neck pain 01/29/2014  . Insomnia 11/28/2013  .  Postconcussion syndrome 11/28/2013    Past Medical History:  Diagnosis Date  . Fibroid   . PTSD (post-traumatic stress disorder)     Family History  Problem Relation Age of Onset  . Cerebral aneurysm Mother   . Dementia Mother   . COPD Father    Past Surgical History:  Procedure Laterality Date  .  ABDOMINAL HYSTERECTOMY    . APPENDECTOMY    . SHOULDER SURGERY    . TONSILLECTOMY     Social History   Social History Narrative   She works in Airline pilot.   Highest level of education:  BS (pre-med).   She lives alone, no children.     There is no immunization history on file for this patient.   Objective: Vital Signs: BP 110/70 (BP Location: Right Arm, Patient Position: Sitting, Cuff Size: Normal)   Pulse (!) 57   Resp 16   Ht 5' 5 (1.651 m)   Wt 139 lb (63 kg)   BMI 23.13 kg/m    Physical Exam Eyes:     Conjunctiva/sclera: Conjunctivae normal.  Cardiovascular:     Rate and Rhythm: Normal rate and regular rhythm.  Pulmonary:     Effort: Pulmonary effort is normal.     Breath sounds: Normal breath sounds.  Musculoskeletal:     Right lower leg: No edema.     Left lower leg: No edema.  Lymphadenopathy:     Cervical: No cervical adenopathy.  Skin:    General: Skin is warm and dry.     Findings: No rash.     Comments: Normal appearing nailfold capillaries  Neurological:     Mental Status: She is alert.  Psychiatric:        Mood and Affect: Mood normal.      Musculoskeletal Exam:  Shoulders full ROM no tenderness or swelling Elbows full ROM no tenderness or swelling Wrists full ROM no tenderness or swelling Fingers full ROM, mild heberdon's nodes present without synovitis, left 4th finger triggering in flexed position without pain, dupuytrens contracture on palm proximal to left 4th finger Hip normal internal and external rotation without pain, no tenderness to lateral hip palpation Knees full ROM no tenderness or swelling Ankles full ROM no tenderness or swelling  Investigation: No additional findings.  Imaging: No results found.  Recent Labs: Lab Results  Component Value Date   WBC 7.3 03/06/2012   HGB 12.5 03/06/2012   NA 139 03/06/2012   K 4.1 03/06/2012   CL 107 03/06/2012   CO2 23 03/06/2012   GLUCOSE 80 03/06/2012   BUN 17 03/06/2012    CREATININE 0.69 03/06/2012   BILITOT 0.7 03/06/2012   ALKPHOS 46 03/06/2012   AST 17 03/06/2012   ALT 14 03/06/2012   PROT 6.3 03/06/2012   ALBUMIN 4.4 03/06/2012   CALCIUM 9.2 03/06/2012    Speciality Comments: No specialty comments available.  Procedures:  No procedures performed Allergies: Penicillins and Other   Assessment / Plan:     Visit Diagnoses: Dupuytren contracture of left hand  Trigger finger, left ring finger  Primary osteoarthritis of both hands  Raynaud's syndrome without gangrene  Orders: No orders of the defined types were placed in this encounter.  No orders of the defined types were placed in this encounter.   Face-to-face time spent with patient was *** minutes. Greater than 50% of time was spent in counseling and coordination of care.  Follow-Up Instructions: Return in about 6 weeks (around 07/17/2024) for New pt OA/trigger f/u  [id number] Lonni LELON Jeannetta, MD  Note - This record has been created using Animal nutritionist.  Chart creation errors have been sought, but may not always  have been located. Such creation errors do not reflect on  the standard of medical care.

## 2024-06-15 ENCOUNTER — Ambulatory Visit (HOSPITAL_BASED_OUTPATIENT_CLINIC_OR_DEPARTMENT_OTHER)
Admission: RE | Admit: 2024-06-15 | Discharge: 2024-06-15 | Disposition: A | Discharge status: Home / Self Care | Source: Ambulatory Visit | Attending: Obstetrics and Gynecology | Admitting: Obstetrics and Gynecology

## 2024-06-15 ENCOUNTER — Ambulatory Visit: Payer: Self-pay | Admitting: Obstetrics and Gynecology

## 2024-06-15 DIAGNOSIS — E2839 Other primary ovarian failure: Secondary | ICD-10-CM | POA: Diagnosis present

## 2024-07-17 ENCOUNTER — Ambulatory Visit: Admitting: Internal Medicine

## 2024-08-22 ENCOUNTER — Encounter: Payer: Self-pay | Admitting: Internal Medicine

## 2024-08-22 ENCOUNTER — Ambulatory Visit: Attending: Internal Medicine | Admitting: Internal Medicine

## 2024-08-22 VITALS — BP 91/58 | Temp 97.3°F | Resp 16 | Ht 65.0 in | Wt 139.0 lb

## 2024-08-22 DIAGNOSIS — M720 Palmar fascial fibromatosis [Dupuytren]: Secondary | ICD-10-CM | POA: Insufficient documentation

## 2024-08-22 DIAGNOSIS — I7300 Raynaud's syndrome without gangrene: Secondary | ICD-10-CM | POA: Insufficient documentation

## 2024-08-22 DIAGNOSIS — M19041 Primary osteoarthritis, right hand: Secondary | ICD-10-CM | POA: Diagnosis not present

## 2024-08-22 DIAGNOSIS — M19042 Primary osteoarthritis, left hand: Secondary | ICD-10-CM | POA: Diagnosis present

## 2024-08-22 NOTE — Progress Notes (Signed)
 Office Visit Note  Patient: Caitlin Pearson [facility]             Date of Birth: Feb 17, 1966           MRN: [id number]             PCP: Burney Darice CROME, MD Referring: Burney Darice CROME, MD Visit Date: 08/22/2024   Subjective:  Medical Management of Chronic Issues   Discussed the use of AI scribe software for clinical note transcription with the patient, who gave verbal consent to proceed.  History of Present Illness   Caitlin Pearson is a 58 year old female with osteoarthritis and Dupuytren's contracture who presents for follow-up of hand symptoms.  Her hand symptoms have improved since the last visit, with a noted decrease in pain. She attributes this improvement to dietary changes and exercises she has been following.  She experiences exacerbation of her hand symptoms during winter due to Raynaud's phenomenon, which causes her hands to turn white and become painful in cold environments like grocery stores. Overall has been mild recently.  She is concerned about potential arthritis in her feet, as she experiences similar symptoms there, particularly in her left foot where she has a bunion. Walking and weather changes exacerbate the bunion pain.  She is not currently taking any oral anti-inflammatory medications such as meloxicam or ibuprofen, as her symptoms are not severe enough to warrant regular use.  She has a significant smoking history but quit 22 years ago. She has a family history of emphysema and lung disease, as her mother had emphysema and her father died from a smoking-related condition.     She had interval bone density scan findings were consistent for osteopenia but not osteoporosis.   Previous HPI 06/05/24 Caitlin Pearson is a 58 year old female who presents with hand pain and stiffness with left palm nodules and fingers triggering.  She has been experiencing pain and stiffness in her hands, particularly affecting her left ring finger, for approximately two years. Symptoms include  pain, a sensation of the finger getting 'stuck', and issues with range of motion. Her symptoms are influenced by weather conditions, with increased discomfort during humid conditions. Over the past year, the severity of her symptoms has increased. Initially, she attempted to manage the pain by using a splint, which exacerbated her symptoms. Keeping her fingers moving, such as during a recent road trip, seems to alleviate the discomfort.  She has Raynaud's phenomenon, which she has experienced since around 2011. She describes episodes where her fingers turn white in response to cold, with pain when blood flow returns. This condition also affects her feet.  She is not taking any specific medication for arthritis but is currently on estrogen and occasionally testosterone. She also takes supplements such as omega-3 and turmeric.  Her family history includes an aunt with scleroderma. Her mother had Alzheimer's and possibly arthritis, but details are unclear.  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 She quit smoking two years ago and maintains a clean diet.     Review of Systems  Constitutional:  Negative for fatigue.  HENT:  Negative for mouth sores and mouth dryness.   Eyes:  Negative for dryness.  Respiratory:  Negative for shortness of breath.   Cardiovascular:  Negative for chest pain and palpitations.  Gastrointestinal:  Negative for blood in stool, constipation and diarrhea.  Endocrine: Negative for increased urination.  Genitourinary:  Negative for involuntary urination.  Musculoskeletal:  Negative for  joint pain, gait problem, joint pain, joint swelling, myalgias, muscle weakness, morning stiffness, muscle tenderness and myalgias.  Skin:  Negative for color change, rash, hair loss and sensitivity to sunlight.  Allergic/Immunologic: Negative for susceptible to infections.  Neurological:  Negative for dizziness and headaches.  Hematological:  Negative for swollen glands.   Psychiatric/Behavioral:  Positive for sleep disturbance. Negative for depressed mood. The patient is not nervous/anxious.     PMFS History:  Patient Active Problem List   Diagnosis Date Noted   Dupuytren contracture of left hand 06/05/2024   Trigger finger, left ring finger 06/05/2024   Osteoarthritis of both hands 06/05/2024   Raynaud's syndrome without gangrene 06/05/2024   Arthralgia of right ankle 11/13/2021   Pain in joint of left shoulder 11/25/2020   Strain of right hamstring 02/21/2018   Neck pain 01/29/2014   Insomnia 11/28/2013   Postconcussion syndrome 11/28/2013    Past Medical History:  Diagnosis Date   Fibroid    PTSD (post-traumatic stress disorder)     Family History  Problem Relation Age of Onset   Cerebral aneurysm Mother    Dementia Mother    COPD Father    Past Surgical History:  Procedure Laterality Date   ABDOMINAL HYSTERECTOMY     APPENDECTOMY     SHOULDER SURGERY     TONSILLECTOMY     Social History   Social History Narrative   She works in Airline pilot.   Highest level of education:  BS (pre-med).   She lives alone, no children.     There is no immunization history on file for this patient.   Objective: Vital Signs: BP (!) 91/58 (Cuff Size: Normal)   Temp (!) 97.3 F (36.3 C)   Resp 16   Ht 5' 5 (1.651 m)   Wt 139 lb (63 kg)   BMI 23.13 kg/m    Physical Exam Eyes:     Conjunctiva/sclera: Conjunctivae normal.  Cardiovascular:     Rate and Rhythm: Normal rate and regular rhythm.  Pulmonary:     Effort: Pulmonary effort is normal.     Breath sounds: Normal breath sounds.  Lymphadenopathy:     Cervical: No cervical adenopathy.  Skin:    General: Skin is warm and dry.     Comments: Picked at nail base and cuticle especially right thumb No digital pitting or ulceration  Neurological:     Mental Status: She is alert.  Psychiatric:        Mood and Affect: Mood normal.      Musculoskeletal Exam:  Shoulders full ROM no tenderness  or swelling Elbows full ROM no tenderness or swelling Wrists full ROM no tenderness or swelling Fingers full ROM, mild heberdon's nodes present without synovitis, no reproducible triggering dupuytrens contracture on palm proximal to left 4th finger Hip normal internal and external rotation without pain, no tenderness to lateral hip palpation Knees full ROM no tenderness or swelling Ankles full ROM no tenderness or swelling    Investigation: No additional findings.  Imaging: No results found.  Recent Labs: Lab Results  Component Value Date   WBC 7.3 03/06/2012   HGB 12.5 03/06/2012   NA 139 03/06/2012   K 4.1 03/06/2012   CL 107 03/06/2012   CO2 23 03/06/2012   GLUCOSE 80 03/06/2012   BUN 17 03/06/2012   CREATININE 0.69 03/06/2012   BILITOT 0.7 03/06/2012   ALKPHOS 46 03/06/2012   AST 17 03/06/2012   ALT 14 03/06/2012   PROT 6.3 03/06/2012  ALBUMIN 4.4 03/06/2012   CALCIUM 9.2 03/06/2012    Speciality Comments: No specialty comments available.  Procedures:  No procedures performed Allergies: Penicillins and Other   Assessment / Plan:     Visit Diagnoses: Osteoarthritis of hands and feet Osteoarthritis in hands and likely in feet. Symptoms in hands improved with dietary changes and exercises. No current use of oral anti-inflammatory medications. - Continue dietary modifications and exercises for osteoarthritis management. - Consider oral anti-inflammatory medications like ibuprofen if symptoms worsen.  Dupuytren's contracture of hand Mild Dupuytren's contracture. Symptoms improved. Conservative management recommended. Discussed potential treatments including steroid and collagenase injections if symptoms worsen. Surgical release not recommended due to mild severity and potential scarring. Smoking is a risk factor. - Continue conservative management for Dupuytren's contracture. - Consider referral to hand specialist for collagenase injection if symptoms  worsen.  Raynaud's phenomenon Raynaud's phenomenon exacerbated by cold weather, causing hands to turn white and become painful.  Bunion of left foot Bunion causing intermittent pain, likely exacerbated by walking and weather changes. Conservative management preferred. Surgery not desired. - Ensure footwear with a wide toe box to reduce irritation.  Osteopenia Bone density indicates osteopenia.        Orders: No orders of the defined types were placed in this encounter.  No orders of the defined types were placed in this encounter.    Follow-Up Instructions: Return in about 6 months (around 02/19/2025), or if symptoms worsen or fail to improve, for OA/trigger finger/dupuytren;s.   Lonni LELON Ester, MD  Note - This record has been created using [employer].  Chart creation errors have been sought, but may not always  have been located. Such creation errors do not reflect on  the standard of medical care.

## 2024-12-04 ENCOUNTER — Other Ambulatory Visit: Payer: Self-pay

## 2024-12-04 MED ORDER — ESTRADIOL 0.0375 MG/24HR TD PTTW: 1.0000 | MEDICATED_PATCH | TRANSDERMAL | 0 refills | Status: AC

## 2024-12-04 NOTE — Telephone Encounter (Signed)
 Med refill request:    ESTRADIOL  0.0375 MG/24HR TD PTTW  Start:  01/10/24 Disp:  8 patches  Refills:  12  Last AEX:  01/10/24 Next AEX:  Not yet scheduled Last MMG (if hormonal med):  12/23/23 Refill authorized? Please Advise.

## 2024-12-14 ENCOUNTER — Telehealth: Payer: Self-pay

## 2024-12-14 NOTE — Telephone Encounter (Signed)
" °*  DOTTI  0.0375 MG/24 HR BIWEEKLY PATCHES  (DX:  N95.1)  All other information provided to CoverMyMeds - Sent to plan.  Key:  [id number]     "

## 2024-12-14 NOTE — Telephone Encounter (Addendum)
 Received Standard Drug Request Form from [facility] Request   Requesting clinical information regarding previous medications, interactions, etc.  Form placed in MD's box for review.   CoverMyMeds (Key: [id number])  CMM is also requesting additional information.  Questions printed and placed in MD's box for review.

## 2025-02-19 ENCOUNTER — Ambulatory Visit: Admitting: Internal Medicine
